# Patient Record
Sex: Female | Born: 2000 | Race: White | Hispanic: No | Marital: Single | Smoking: Never smoker
Health system: Southern US, Community
[De-identification: ages and names within clinical notes are randomized; demographics above are authoritative.]

## PROBLEM LIST (undated history)

## (undated) ENCOUNTER — Inpatient Hospital Stay (HOSPITAL_COMMUNITY): Payer: Self-pay

## (undated) DIAGNOSIS — Z1589 Genetic susceptibility to other disease: Secondary | ICD-10-CM

## (undated) DIAGNOSIS — E282 Polycystic ovarian syndrome: Secondary | ICD-10-CM

## (undated) DIAGNOSIS — E7212 Methylenetetrahydrofolate reductase deficiency: Secondary | ICD-10-CM

## (undated) HISTORY — PX: NO PAST SURGERIES: SHX2092

## (undated) HISTORY — DX: Genetic susceptibility to other disease: Z15.89

## (undated) HISTORY — DX: Methylenetetrahydrofolate reductase deficiency: E72.12

---
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
## 2018-08-20 IMAGING — MR MR HEAD WO/W CM
17 series · 48 of 48 positions shown · IV contrast (MULTIHANCE)
Comparison: None.

CLINICAL DATA: Hair loss for 2 weeks, irregular menstrual cycles.
Assess for pituitary tumor.

EXAM:
MRI HEAD WITHOUT AND WITH CONTRAST
TECHNIQUE: Multiplanar, multiecho pulse sequences of the brain and surrounding
structures were obtained without and with intravenous contrast.
CONTRAST:  9mL MULTIHANCE GADOBENATE DIMEGLUMINE 529 MG/ML IV SOLN

[Series 5: T1 · sagittal · 4.0mm · 0.72mm/px · 1 of 27 slices shown (1 of 5)]
[im 1/27]
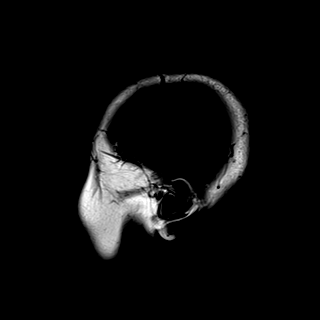

[Series 6: DWI · axial · 3.0mm · 1.44mm/px · z∈[-42,+118]mm · 8 of 84 slices shown (1 of 2)]
[im 1/84]
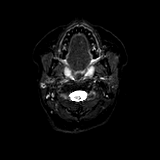
[im 12/84]
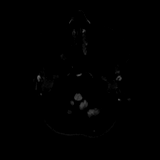
[im 24/84]
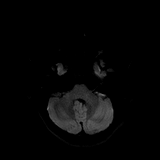
[im 36/84]
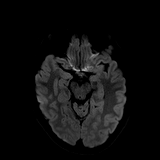
[im 48/84]
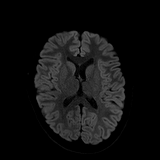
[im 60/84]
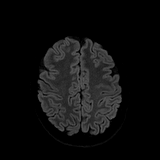
[im 72/84]
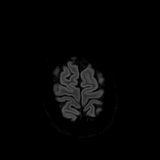
[im 84/84]
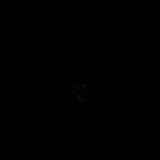

[Series 7: DWI · axial · 3.0mm · 1.44mm/px · z∈[-42,+118]mm · 4 of 42 slices shown (2 of 2)]
[im 1/42]
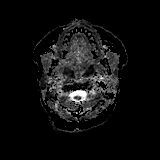
[im 14/42]
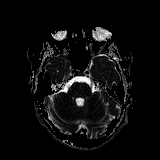
[im 28/42]
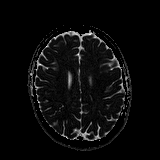
[im 42/42]
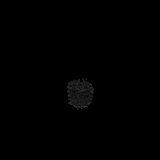

[Series 8: T2 · axial · 4.0mm · 0.36mm/px · z∈[-29,+122]mm · 3 of 30 slices shown]
[im 1/30]
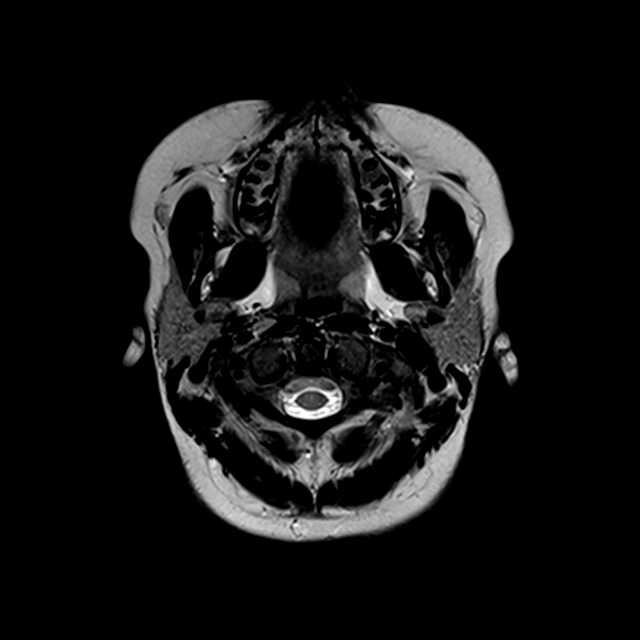
[im 15/30]
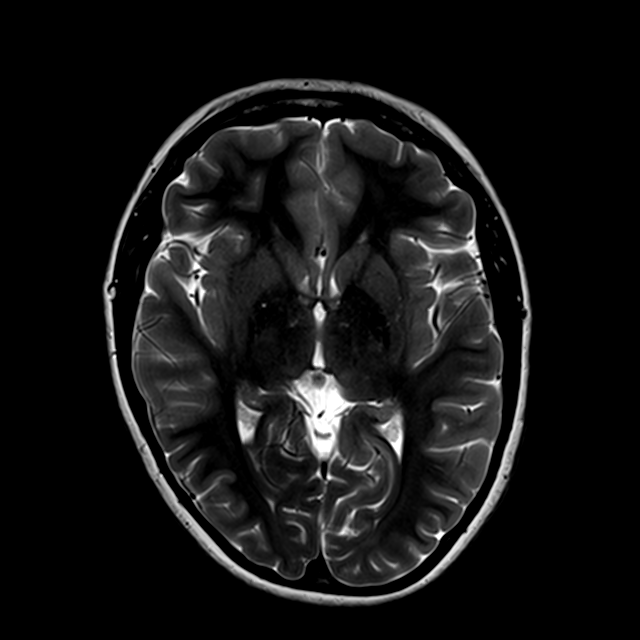
[im 30/30]
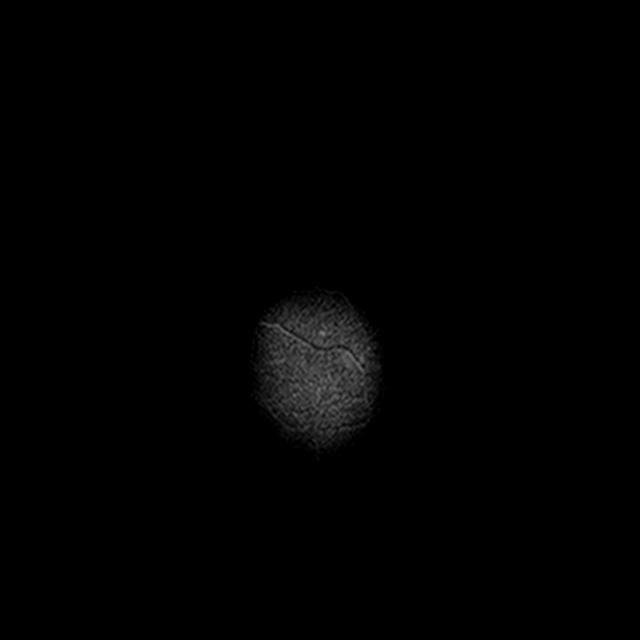

[Series 9: GRE · axial · 4.0mm · 0.69mm/px · z∈[-28,+122]mm · 3 of 30 slices shown]
[im 1/30]
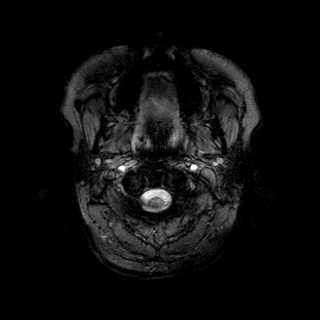
[im 15/30]
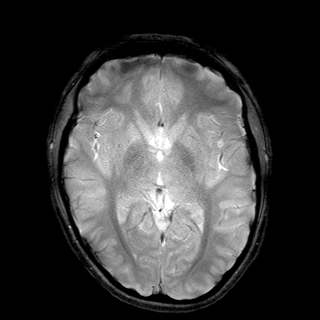
[im 30/30]
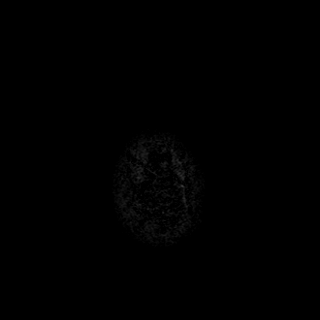

[Series 10: T1 · sagittal · 3.0mm · 0.42mm/px · 1 of 13 slices shown (2 of 5)]
[im 1/13]
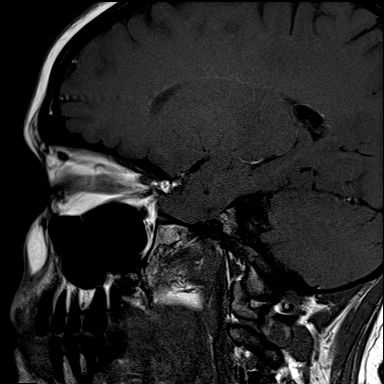

[Series 11: FLAIR · axial · 3.0mm · 0.72mm/px · z∈[-22,+116]mm · 2 of 24 slices shown]
[im 1/24]
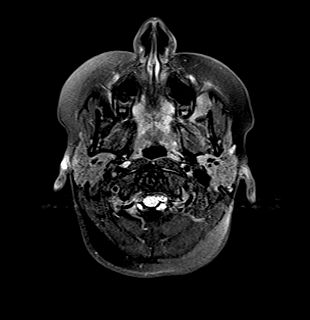
[im 24/24]
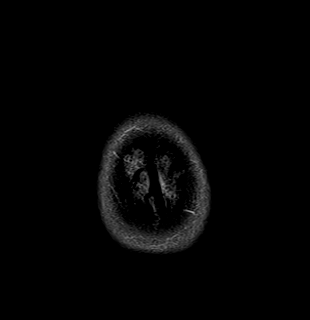

[Series 12: T1 · coronal · 3.0mm · 0.42mm/px · 1 of 10 slices shown (3 of 5)]
[im 1/10]
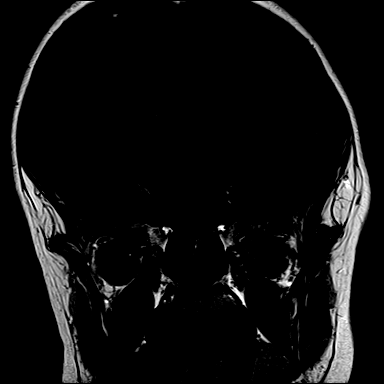

[Series 13: T1 · coronal · non-contrast · 3.0mm · 0.62mm/px · 1 of 9 slices shown (4 of 5)]
[im 1/9]
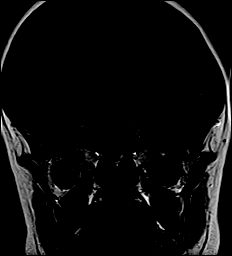

[Series 14: cor post dyn · coronal · 3.0mm · 0.62mm/px · 1 of 9 slices shown (1 of 6)]
[im 1/9]
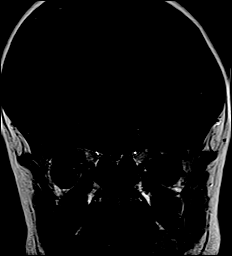

[Series 15: cor post dyn · coronal · 3.0mm · 0.62mm/px · 1 of 9 slices shown (2 of 6)]
[im 1/9]
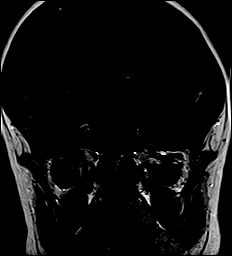

[Series 16: cor post dyn · coronal · 3.0mm · 0.62mm/px · 1 of 9 slices shown (3 of 6)]
[im 1/9]
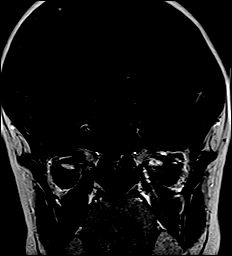

[Series 17: cor post dyn · coronal · 3.0mm · 0.62mm/px · 1 of 9 slices shown (4 of 6)]
[im 1/9]
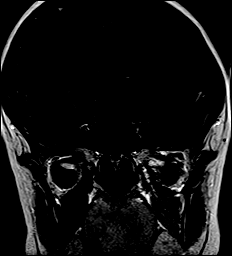

[Series 18: cor post dyn · coronal · 3.0mm · 0.62mm/px · 1 of 9 slices shown (5 of 6)]
[im 1/9]
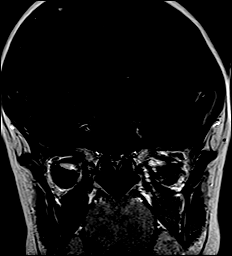

[Series 19: cor post dyn · coronal · 3.0mm · 0.62mm/px · 1 of 9 slices shown (6 of 6)]
[im 1/9]
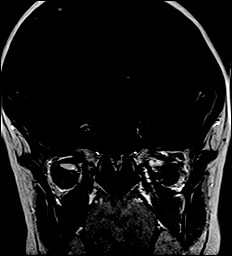

[Series 20: T1 post-contrast · sagittal · 3.0mm · 0.42mm/px · 1 of 13 slices shown (1 of 3)]
[im 1/13]
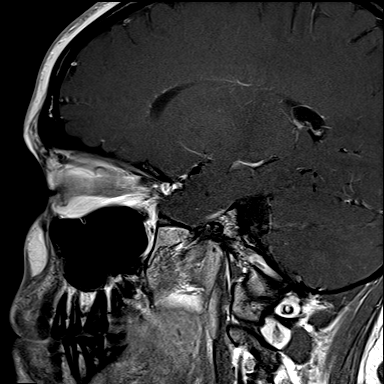

[Series 21: T1 post-contrast · coronal · 3.0mm · 0.42mm/px · 1 of 10 slices shown (2 of 3)]
[im 1/10]
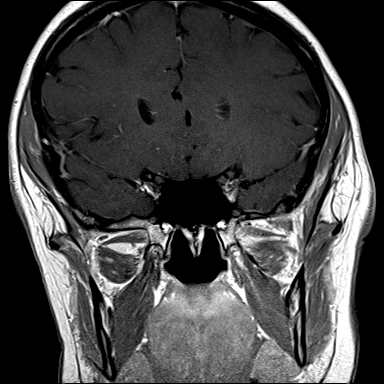

[Series 22: T1 · axial · 1.0mm · 0.86mm/px · z∈[-24,+118]mm · 13 of 144 slices shown (5 of 5)]
[im 1/144]
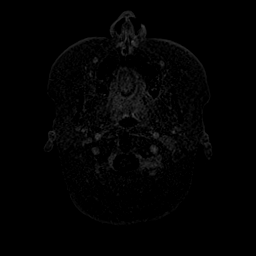
[im 12/144]
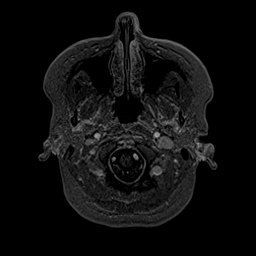
[im 24/144]
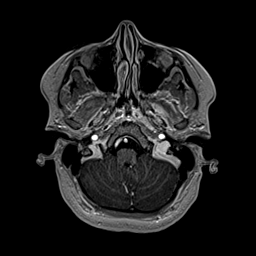
[im 36/144]
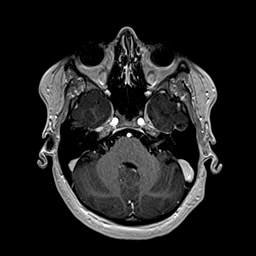
[im 48/144]
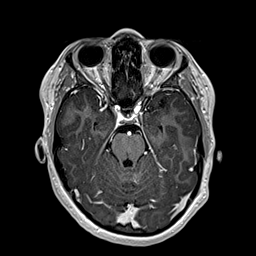
[im 60/144]
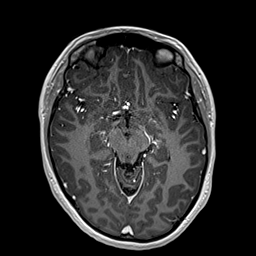
[im 72/144]
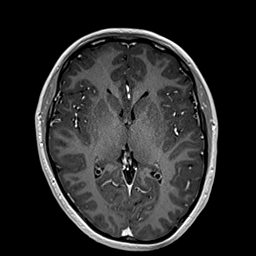
[im 84/144]
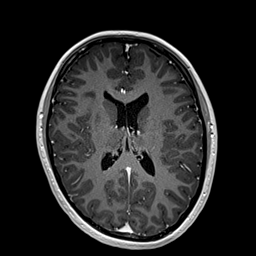
[im 96/144]
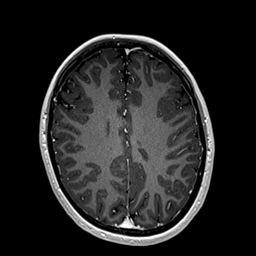
[im 108/144]
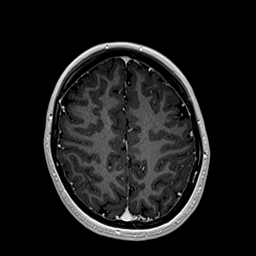
[im 120/144]
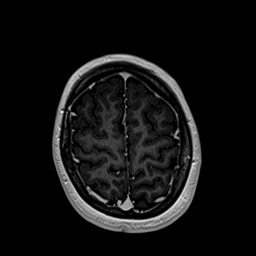
[im 132/144]
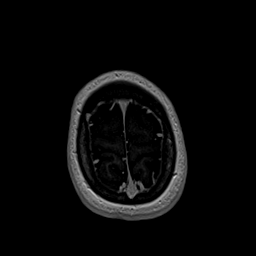
[im 144/144]
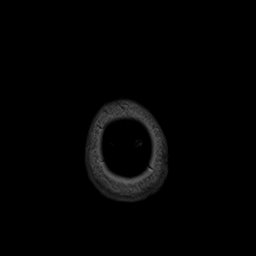

[Series 23: T1 post-contrast · coronal · 4.0mm · 0.47mm/px · 3 of 34 slices shown (3 of 3)]
[im 1/34]
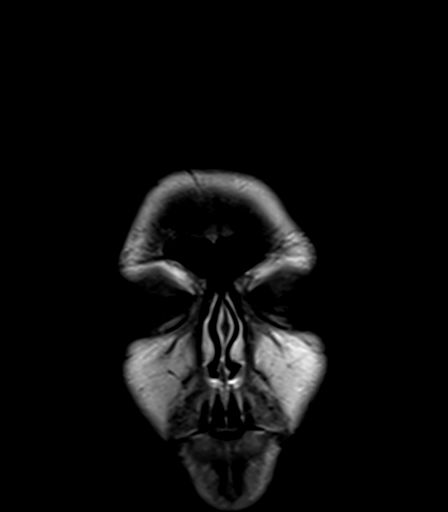
[im 17/34]
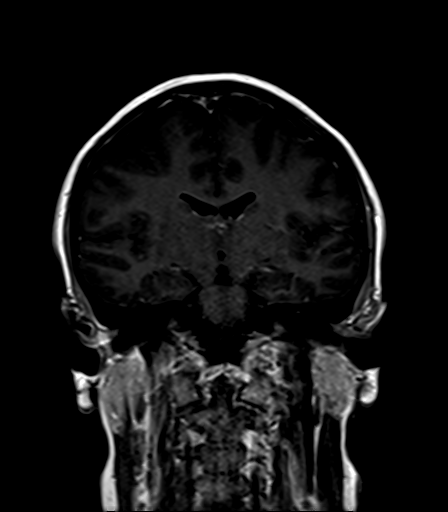
[im 34/34]
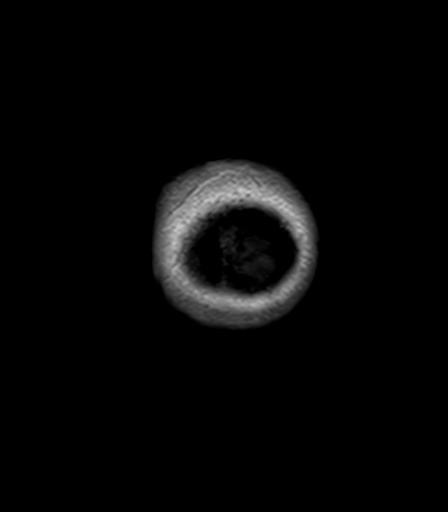

[48 of 48 positions shown; findings below may reference images not displayed]

FINDINGS: SELLA: No abnormal sellar expansion. Normal posterior pituitary
bright spot. No early foci of suspicious hypo enhancement though,
diet issues structures are mildly motion degraded. Pituitary stalk
is midline with normal enhancement. Symmetric appearance of the
cavernous sinuses and cavernous sinus flow voids. Normal appearance
of the overlying optic chiasm.

INTRACRANIAL CONTENTS: No reduced diffusion to suggest acute
ischemia or hypercellular tumor. The ventricles and sulci are normal
for patient's age. No suspicious parenchymal signal, mass lesions,
mass effect. No abnormal intraparenchymal or extra-axial
enhancement. No abnormal extra-axial fluid collections.

VASCULAR: Normal major intracranial vascular flow voids present at
skull base.

ORBITS: The ocular globes and orbital contents are non-suspicious.

SINUSES: LEFT maxillary mucosal retention cysts without paranasal
sinus air-fluid levels. Mastoid air cells are well aerated.

SKULL/SOFT TISSUES: No suspicious calvarial bone marrow signal.
Craniocervical junction maintained.
IMPRESSION: 1. Normal MRI of the brain and sella with and without contrast.

## 2018-09-14 NOTE — Progress Notes (Addendum)
17 y.o. G0P0 Single White or Caucasian Not Hispanic or Latino female here for annual exam.   Period Cycle (Days): 30 Period Duration (Days): 3-6 Period Pattern: Regular Menstrual Flow: Moderate Menstrual Control: Maxi pad, Tampon Menstrual Control Change Freq (Hours): changes pad/tampon 4 times a day Dysmenorrhea: (!) Severe Dysmenorrhea Symptoms: Cramping, Nausea, Other (Comment)(back pain )  The cramps are bad for 1.5 weeks, helped with ibuprofen and tumeric.  Endocrinologist diagnosed her with PCOS, at that time she was having irregular cycles. She was on metformin short term, didn't help she went off of it. She lost weight on her own.   She has multiple GI sensitivities, has some baseline constipation. TSH was okay. She has cut out gluten and dairy.  Has a BM 2-3 x a day, firm.  Not sexually active  Patient's last menstrual period was 09/08/2018.          Sexually active: No.  The current method of family planning is none.    Exercising: Yes.    cardio/weights Smoker:  no  Health Maintenance: Pap:  N/A History of abnormal Pap:  no TDaP:  2011 Gardasil: no    reports that she has never smoked. She has never used smokeless tobacco. She reports that she does not drink alcohol or use drugs. She is a sophomore in nursing school.   History reviewed. No pertinent past medical history.  Past Surgical History:  Procedure Laterality Date  . NO PAST SURGERIES      No current outpatient medications on file.   No current facility-administered medications for this visit.     Family History  Problem Relation Age of Onset  . Thyroid disease Mother   . Endometriosis Mother   . Uterine cancer Maternal Grandmother   . Heart disease Maternal Grandfather   . Hypertension Maternal Grandfather   . Hypertension Paternal Grandmother   . Thyroid disease Paternal Grandmother   . ALS Paternal Grandmother   . Leukemia Paternal Grandfather     Review of Systems  Constitutional: Negative.    HENT: Negative.   Eyes: Negative.   Respiratory: Negative.   Cardiovascular: Positive for palpitations.  Gastrointestinal: Positive for abdominal distention, abdominal pain and nausea.  Endocrine: Positive for cold intolerance.  Genitourinary: Positive for menstrual problem.       Breast pain  Loss of urine with sneeze or cough  Vulvar/vaginal itching   Musculoskeletal: Negative.   Skin: Negative.   Allergic/Immunologic: Negative.   Neurological: Negative.   Hematological: Bruises/bleeds easily.  Psychiatric/Behavioral: Negative.     Exam:   BP 98/64 (BP Location: Right Arm, Patient Position: Sitting, Cuff Size: Normal)   Pulse 74   Resp 14   Ht 5' 3.5" (1.613 m)   Wt 179 lb (81.2 kg)   LMP 09/08/2018   BMI 31.21 kg/m   Weight change: @WEIGHTCHANGE @ Height:   Height: 5' 3.5" (161.3 cm)  Ht Readings from Last 3 Encounters:  09/18/18 5' 3.5" (1.613 m) (38 %, Z= -0.31)*  12/26/14 5' 8.5" (1.74 m) (96 %, Z= 1.79)*  09/02/13 5\' 4"  (1.626 m) (60 %, Z= 0.24)*   * Growth percentiles are based on CDC (Girls, 2-20 Years) data.    General appearance: alert, cooperative and appears stated age Head: Normocephalic, without obvious abnormality, atraumatic Neck: no adenopathy, supple, symmetrical, trachea midline and thyroid normal to inspection and palpation Lungs: clear to auscultation bilaterally Cardiovascular: regular rate and rhythm Breasts: normal appearance, no masses or tenderness Abdomen: soft, non-tender; non distended,  no  masses,  no organomegaly Extremities: extremities normal, atraumatic, no cyanosis or edema Skin: Skin color, texture, turgor normal. No rashes or lesions Lymph nodes: Cervical, supraclavicular, and axillary nodes normal. No abnormal inguinal nodes palpated Neurologic: Grossly normal   Pelvic: External genitalia:  no lesions              Urethra:  normal appearing urethra with no masses, tenderness or lesions              Bartholins and Skenes:  normal                 Cervix: no cervical motion tenderness               Bimanual Exam:  Uterus:  anteverted, mobile, +/- tender, not appreciably enlarged              Adnexa: no mass, fullness, tenderness               Pelvic floor: not tender   Bladder: mildly tender   Chaperone was present for exam.  A:  Well Woman with normal exam  Mom with MTHFR gene, patient may have been checked, will get results  Severe dysmenorrhea  GI sensitivities, has abdominal pain and constipation from eating cafeteria food. Requests form be filled out so she can cook her own food  ?elevated prolactin  Was diagnosed with PCOS, no hirsutism or acne, normal monthly cycles with 20 lb weight loss    P:   No pap, no std testing  Get a copy of her labs from Endocrine and Primary  If her MTHFR gene was negative will start OCP's, risks reviewed and information was given  If MTHFR was +, consider mirena IUD, information given  Continue ibuprofen as needed  Discussed breast self awareness   Awaiting last prolactin level, suspect she needs recheck  Records from Dr Sharl MaKerr reviewed: She had a mildly elevated prolactin of 13.91 (2.64-13.13) in 2018 She had a normal brain MRI in 10/18 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  If homocysteine level is normal, will call in OCP's

## 2018-09-18 ENCOUNTER — Other Ambulatory Visit: Payer: Self-pay

## 2018-09-18 ENCOUNTER — Encounter: Payer: Self-pay | Admitting: Obstetrics and Gynecology

## 2018-09-18 ENCOUNTER — Ambulatory Visit: Payer: BLUE CROSS/BLUE SHIELD | Admitting: Obstetrics and Gynecology

## 2018-09-18 VITALS — BP 98/64 | HR 74 | Resp 14 | Ht 63.5 in | Wt 179.0 lb

## 2018-09-18 DIAGNOSIS — Z01419 Encounter for gynecological examination (general) (routine) without abnormal findings: Secondary | ICD-10-CM

## 2018-09-18 DIAGNOSIS — N946 Dysmenorrhea, unspecified: Secondary | ICD-10-CM

## 2018-09-18 NOTE — Patient Instructions (Addendum)
Oral Contraception Information Oral contraceptive pills (OCPs) are medicines taken to prevent pregnancy. OCPs work by preventing the ovaries from releasing eggs. The hormones in OCPs also cause the cervical mucus to thicken, preventing the sperm from entering the uterus. The hormones also cause the uterine lining to become thin, not allowing a fertilized egg to attach to the inside of the uterus. OCPs are highly effective when taken exactly as prescribed. However, OCPs do not prevent sexually transmitted diseases (STDs). Safe sex practices, such as using condoms along with the pill, can help prevent STDs. Before taking the pill, you may have a physical exam and Pap test. Your health care provider may order blood tests. The health care provider will make sure you are a good candidate for oral contraception. Discuss with your health care provider the possible side effects of the OCP you may be prescribed. When starting an OCP, it can take 2 to 3 months for the body to adjust to the changes in hormone levels in your body. Types of oral contraception  The combination pill-This pill contains estrogen and progestin (synthetic progesterone) hormones. The combination pill comes in 21-day, 28-day, or 91-day packs. Some types of combination pills are meant to be taken continuously (365-day pills). With 21-day packs, you do not take pills for 7 days after the last pill. With 28-day packs, the pill is taken every day. The last 7 pills are without hormones. Certain types of pills have more than 21 hormone-containing pills. With 91-day packs, the first 84 pills contain both hormones, and the last 7 pills contain no hormones or contain estrogen only.  The minipill-This pill contains the progesterone hormone only. The pill is taken every day continuously. It is very important to take the pill at the same time each day. The minipill comes in packs of 28 pills. All 28 pills contain the hormone. Advantages of oral  contraceptive pills  Decreases premenstrual symptoms.  Treats menstrual period cramps.  Regulates the menstrual cycle.  Decreases a heavy menstrual flow.  May treatacne, depending on the type of pill.  Treats abnormal uterine bleeding.  Treats polycystic ovarian syndrome.  Treats endometriosis.  Can be used as emergency contraception. Things that can make oral contraceptive pills less effective OCPs can be less effective if:  You forget to take the pill at the same time every day.  You have a stomach or intestinal disease that lessens the absorption of the pill.  You take OCPs with other medicines that make OCPs less effective, such as antibiotics, certain HIV medicines, and some seizure medicines.  You take expired OCPs.  You forget to restart the pill on day 7, when using the packs of 21 pills.  Risks associated with oral contraceptive pills Oral contraceptive pills can sometimes cause side effects, such as:  Headache.  Nausea.  Breast tenderness.  Irregular bleeding or spotting.  Combination pills are also associated with a small increased risk of:  Blood clots.  Heart attack.  Stroke.  This information is not intended to replace advice given to you by your health care provider. Make sure you discuss any questions you have with your health care provider. Document Released: 01/01/2003 Document Revised: 03/18/2016 Document Reviewed: 04/01/2013 Elsevier Interactive Patient Education  2018 [employer]   EXERCISE AND DIET:  We recommended that you start or continue a regular exercise program for good health. Regular exercise means any activity that makes your heart beat faster and makes you sweat.  We recommend exercising at least 30   minutes per day at least 3 days a week, preferably 4 or 5.  We also recommend a diet low in fat and sugar.  Inactivity, poor dietary choices and obesity can cause diabetes, heart attack, stroke, and kidney damage, among others.      ALCOHOL AND SMOKING:  Women should limit their alcohol intake to no more than 7 drinks/beers/glasses of wine (combined, not each!) per week. Moderation of alcohol intake to this level decreases your risk of breast cancer and liver damage. And of course, no recreational drugs are part of a healthy lifestyle.  And absolutely no smoking or even second hand smoke. Most people know smoking can cause heart and lung diseases, but did you know it also contributes to weakening of your bones? Aging of your skin?  Yellowing of your teeth and nails?  CALCIUM AND VITAMIN D:  Adequate intake of calcium and Vitamin D are recommended.  The recommendations for exact amounts of these supplements seem to change often, but generally speaking 600 mg of calcium (either carbonate or citrate) and 800 units of Vitamin D per day seems prudent. Certain women may benefit from higher intake of Vitamin D.  If you are among these women, your doctor will have told you during your visit.    PAP SMEARS:  Pap smears, to check for cervical cancer or precancers,  have traditionally been done yearly, although recent scientific advances have shown that most women can have pap smears less often.  However, every woman still should have a physical exam from her gynecologist every year. It will include a breast check, inspection of the vulva and vagina to check for abnormal growths or skin changes, a visual exam of the cervix, and then an exam to evaluate the size and shape of the uterus and ovaries.  And after 19 years of age, a rectal exam is indicated to check for rectal cancers. We will also provide age appropriate advice regarding health maintenance, like when you should have certain vaccines, screening for sexually transmitted diseases, bone density testing, colonoscopy, mammograms, etc.   MAMMOGRAMS:  All women over 40 years old should have a yearly mammogram. Many facilities now offer a "3D" mammogram, which may cost around $50 extra out of  pocket. If possible,  we recommend you accept the option to have the 3D mammogram performed.  It both reduces the number of women who will be called back for extra views which then turn out to be normal, and it is better than the routine mammogram at detecting truly abnormal areas.    COLONOSCOPY:  Colonoscopy to screen for colon cancer is recommended for all women at age 50.  We know, you hate the idea of the prep.  We agree, BUT, having colon cancer and not knowing it is worse!!  Colon cancer so often starts as a polyp that can be seen and removed at colonscopy, which can quite literally save your life!  And if your first colonoscopy is normal and you have no family history of colon cancer, most women don't have to have it again for 10 years.  Once every ten years, you can do something that may end up saving your life, right?  We will be happy to help you get it scheduled when you are ready.  Be sure to check your insurance coverage so you understand how much it will cost.  It may be covered as a preventative service at no cost, but you should check your particular policy.        Breast Self-Awareness Breast self-awareness means being familiar with how your breasts look and feel. It involves checking your breasts regularly and reporting any changes to your health care provider. Practicing breast self-awareness is important. A change in your breasts can be a sign of a serious medical problem. Being familiar with how your breasts look and feel allows you to find any problems early, when treatment is more likely to be successful. All women should practice breast self-awareness, including women who have had breast implants. How to do a breast self-exam One way to learn what is normal for your breasts and whether your breasts are changing is to do a breast self-exam. To do a breast self-exam: Look for Changes  Remove all the clothing above your waist. Stand in front of a mirror in a room with good  lighting. Put your hands on your hips. Push your hands firmly downward. Compare your breasts in the mirror. Look for differences between them (asymmetry), such as: Differences in shape. Differences in size. Puckers, dips, and bumps in one breast and not the other. Look at each breast for changes in your skin, such as: Redness. Scaly areas. Look for changes in your nipples, such as: Discharge. Bleeding. Dimpling. Redness. A change in position. Feel for Changes  Carefully feel your breasts for lumps and changes. It is best to do this while lying on your back on the floor and again while sitting or standing in the shower or tub with soapy water on your skin. Feel each breast in the following way: Place the arm on the side of the breast you are examining above your head. Feel your breast with the other hand. Start in the nipple area and make  inch (2 cm) overlapping circles to feel your breast. Use the pads of your three middle fingers to do this. Apply light pressure, then medium pressure, then firm pressure. The light pressure will allow you to feel the tissue closest to the skin. The medium pressure will allow you to feel the tissue that is a little deeper. The firm pressure will allow you to feel the tissue close to the ribs. Continue the overlapping circles, moving downward over the breast until you feel your ribs below your breast. Move one finger-width toward the center of the body. Continue to use the  inch (2 cm) overlapping circles to feel your breast as you move slowly up toward your collarbone. Continue the up and down exam using all three pressures until you reach your armpit.  Write Down What You Find  Write down what is normal for each breast and any changes that you find. Keep a written record with breast changes or normal findings for each breast. By writing this information down, you do not need to depend only on memory for size, tenderness, or location. Write down where  you are in your menstrual cycle, if you are still menstruating. If you are having trouble noticing differences in your breasts, do not get discouraged. With time you will become more familiar with the variations in your breasts and more comfortable with the exam. How often should I examine my breasts? Examine your breasts every month. If you are breastfeeding, the best time to examine your breasts is after a feeding or after using a breast pump. If you menstruate, the best time to examine your breasts is 5-7 days after your period is over. During your period, your breasts are lumpier, and it may be more difficult to notice changes. When should I   see my health care provider? See your health care provider if you notice: A change in shape or size of your breasts or nipples. A change in the skin of your breast or nipples, such as a reddened or scaly area. Unusual discharge from your nipples. A lump or thick area that was not there before. Pain in your breasts. Anything that concerns you.  This information is not intended to replace advice given to you by your health care provider. Make sure you discuss any questions you have with your health care provider. Document Released: 10/11/2005 Document Revised: 03/18/2016 Document Reviewed: 08/31/2015 Elsevier Interactive Patient Education  2018 [employer] Inc[employer]  

## 2018-09-29 ENCOUNTER — Encounter: Payer: Self-pay | Admitting: Obstetrics and Gynecology

## 2018-09-29 ENCOUNTER — Telehealth: Payer: Self-pay | Admitting: Obstetrics and Gynecology

## 2018-09-29 DIAGNOSIS — Z1589 Genetic susceptibility to other disease: Secondary | ICD-10-CM | POA: Insufficient documentation

## 2018-09-29 DIAGNOSIS — Z01419 Encounter for gynecological examination (general) (routine) without abnormal findings: Secondary | ICD-10-CM

## 2018-09-29 DIAGNOSIS — Z0000 Encounter for general adult medical examination without abnormal findings: Secondary | ICD-10-CM

## 2018-09-29 DIAGNOSIS — E7212 Methylenetetrahydrofolate reductase deficiency: Secondary | ICD-10-CM

## 2018-09-29 NOTE — Telephone Encounter (Signed)
Please let the patient know that I got her records from Dr Sharl MaKerr and she needs another prolactin level. She should also have a fasting lipid panel and HgbA1C secondary to her diagnosis of PCOS.  I also have the records from her primary. As her mom said she is heterozygous for the MTHFR gene. This in itself is not likely to increase her risk of clotting on OCP's. To be very careful, please also order a fasting homocysteine level on her, if that's normal she can start OCP's. So please have her come in for: Prolactin, fasting lipid panel, HgbA1C, fasting homocysteine level.

## 2018-10-02 NOTE — Telephone Encounter (Signed)
Left message to call Tamara Sutton at [phone]. 

## 2018-10-10 NOTE — Telephone Encounter (Signed)
Patient returned call and message from Dr. Oscar LaJertson given.  Patient is scheduled for fasting lab work tomorrow.  She will call her insurance to discuss cost, she may need to reschedule. Advised call back if she needs to reschedule.  Encounter closed.

## 2018-10-11 ENCOUNTER — Other Ambulatory Visit: Payer: Self-pay

## 2018-10-11 ENCOUNTER — Telehealth: Payer: Self-pay | Admitting: Obstetrics and Gynecology

## 2018-10-11 NOTE — Telephone Encounter (Signed)
Call to patient regarding missed lab appointment. Her mom is checking with insurance to see if labs are covered before coming in.

## 2019-01-22 ENCOUNTER — Encounter: Payer: Self-pay | Admitting: Obstetrics and Gynecology

## 2019-01-22 ENCOUNTER — Other Ambulatory Visit: Payer: Self-pay

## 2019-01-22 ENCOUNTER — Telehealth: Payer: Self-pay | Admitting: Obstetrics and Gynecology

## 2019-01-22 ENCOUNTER — Ambulatory Visit: Payer: BLUE CROSS/BLUE SHIELD | Admitting: Obstetrics and Gynecology

## 2019-01-22 VITALS — BP 104/58 | HR 64 | Temp 98.0°F | Resp 12 | Ht 63.5 in | Wt 178.0 lb

## 2019-01-22 DIAGNOSIS — Z113 Encounter for screening for infections with a predominantly sexual mode of transmission: Secondary | ICD-10-CM | POA: Diagnosis not present

## 2019-01-22 DIAGNOSIS — N898 Other specified noninflammatory disorders of vagina: Secondary | ICD-10-CM | POA: Diagnosis not present

## 2019-01-22 DIAGNOSIS — N760 Acute vaginitis: Secondary | ICD-10-CM | POA: Diagnosis not present

## 2019-01-22 MED ORDER — NYSTATIN-TRIAMCINOLONE 100000-0.1 UNIT/GM-% EX CREA: 1.0000 "application " | TOPICAL_CREAM | Freq: Two times a day (BID) | CUTANEOUS | 0 refills | Status: DC

## 2019-01-22 NOTE — Telephone Encounter (Signed)
Patient has a discharge with odor and anus is painful and itchy.

## 2019-01-22 NOTE — Patient Instructions (Signed)
Vaginitis  Vaginitis is a condition in which the vaginal tissue swells and becomes red (inflamed). This condition is most often caused by a change in the normal balance of bacteria and yeast that live in the vagina. This change causes an overgrowth of certain bacteria or yeast, which causes the inflammation. There are different types of vaginitis, but the most common types are:   Bacterial vaginosis.   Yeast infection (candidiasis).   Trichomoniasis vaginitis. This is a sexually transmitted disease (STD).   Viral vaginitis.   Atrophic vaginitis.   Allergic vaginitis.  What are the causes?  The cause of this condition depends on the type of vaginitis. It can be caused by:   Bacteria (bacterial vaginosis).   Yeast, which is a fungus (yeast infection).   A parasite (trichomoniasis vaginitis).   A virus (viral vaginitis).   Low hormone levels (atrophic vaginitis). Low hormone levels can occur during pregnancy, breastfeeding, or after menopause.   Irritants, such as bubble baths, scented tampons, and feminine sprays (allergic vaginitis).  Other factors can change the normal balance of the yeast and bacteria that live in the vagina. These include:   Antibiotic medicines.   Poor hygiene.   Diaphragms, vaginal sponges, spermicides, birth control pills, and intrauterine devices (IUD).   Sex.   Infection.   Uncontrolled diabetes.   A weakened defense (immune) system.  What increases the risk?  This condition is more likely to develop in women who:   Smoke.   Use vaginal douches, scented tampons, or scented sanitary pads.   Wear tight-fitting pants.   Wear thong underwear.   Use oral birth control pills or an IUD.   Have sex without a condom.   Have multiple sex partners.   Have an STD.   Frequently use the spermicide nonoxynol-9.   Eat lots of foods high in sugar.   Have uncontrolled diabetes.   Have low estrogen levels.   Have a weakened immune system from an immune disorder or medical  treatment.   Are pregnant or breastfeeding.  What are the signs or symptoms?  Symptoms vary depending on the cause of the vaginitis. Common symptoms include:   Abnormal vaginal discharge.  ? The discharge is white, gray, or yellow with bacterial vaginosis.  ? The discharge is thick, white, and cheesy with a yeast infection.  ? The discharge is frothy and yellow or greenish with trichomoniasis.   A bad vaginal smell. The smell is fishy with bacterial vaginosis.   Vaginal itching, pain, or swelling.   Sex that is painful.   Pain or burning when urinating.  Sometimes there are no symptoms.  How is this diagnosed?  This condition is diagnosed based on your symptoms and medical history. A physical exam, including a pelvic exam, will also be done. You may also have other tests, including:   Tests to determine the pH level (acidity or alkalinity) of your vagina.   A whiff test, to assess the odor that results when a sample of your vaginal discharge is mixed with a potassium hydroxide solution.   Tests of vaginal fluid. A sample will be examined under a microscope.  How is this treated?  Treatment varies depending on the type of vaginitis you have. Your treatment may include:   Antibiotic creams or pills to treat bacterial vaginosis and trichomoniasis.   Antifungal medicines, such as vaginal creams or suppositories, to treat a yeast infection.   Medicine to ease discomfort if you have viral vaginitis. Your sexual partner   should also be treated.   Estrogen delivered in a cream, pill, suppository, or vaginal ring to treat atrophic vaginitis. If vaginal dryness occurs, lubricants and moisturizing creams may help. You may need to avoid scented soaps, sprays, or douches.   Stopping use of a product that is causing allergic vaginitis. Then using a vaginal cream to treat the symptoms.  Follow these instructions at home:  Lifestyle   Keep your genital area clean and dry. Avoid soap, and only rinse the area with  water.   Do not douche or use tampons until your health care provider says it is okay to do so. Use sanitary pads, if needed.   Do not have sex until your health care provider approves. When you can return to sex, practice safe sex and use condoms.   Wipe from front to back. This avoids the spread of bacteria from the rectum to the vagina.  General instructions   Take over-the-counter and prescription medicines only as told by your health care provider.   If you were prescribed an antibiotic medicine, take or use it as told by your health care provider. Do not stop taking or using the antibiotic even if you start to feel better.   Keep all follow-up visits as told by your health care provider. This is important.  How is this prevented?   Use mild, non-scented products. Do not use things that can irritate the vagina, such as fabric softeners. Avoid the following products if they are scented:  ? Feminine sprays.  ? Detergents.  ? Tampons.  ? Feminine hygiene products.  ? Soaps or bubble baths.   Let air reach your genital area.  ? Wear cotton underwear to reduce moisture buildup.  ? Avoid wearing underwear while you sleep.  ? Avoid wearing tight pants and underwear or nylons without a cotton panel.  ? Avoid wearing thong underwear.   Take off any wet clothing, such as bathing suits, as soon as possible.   Practice safe sex and use condoms.  Contact a health care provider if:   You have abdominal pain.   You have a fever.   You have symptoms that last for more than 2-3 days.  Get help right away if:   You have a fever and your symptoms suddenly get worse.  Summary   Vaginitis is a condition in which the vaginal tissue becomes inflamed.This condition is most often caused by a change in the normal balance of bacteria and yeast that live in the vagina.   Treatment varies depending on the type of vaginitis you have.   Do not douche, use tampons , or have sex until your health care provider approves. When  you can return to sex, practice safe sex and use condoms.  This information is not intended to replace advice given to you by your health care provider. Make sure you discuss any questions you have with your health care provider.  Document Released: 08/08/2007 Document Revised: 11/16/2016 Document Reviewed: 11/16/2016  Elsevier Interactive Patient Education  2019 [employer] Inc[employer]

## 2019-01-22 NOTE — Progress Notes (Signed)
GYNECOLOGY  VISIT   HPI: 18 y.o.   Single  Caucasian  female   G0P0 with Patient's last menstrual period was 01/10/2019.   here for vaginal odor, discharge, and itching in vagina and rectum x 3 weeks   Discharge is white and clear.  Feeling vaginal burning.   Notes anal itching.  Difficulty sleeping due to itching.  Bothersome during the day as well.   No change in exposures.  Sweating more and asking if this is the cause of her symptoms.   Last sexual activity was December.  No protection used.   Denies pelvic pain or abnormal uterine bleeding.   GYNECOLOGIC HISTORY: Patient's last menstrual period was 01/10/2019. Contraception:  None -- per patient, not SA since December 2019 Menopausal hormone therapy:  none Last mammogram:  n/a Last pap smear:   n/a        OB History    Gravida  0   Para      Term      Preterm      AB      Living        SAB      TAB      Ectopic      Multiple      Live Births                 Patient Active Problem List   Diagnosis Date Noted  . Heterozygous for MTHFR gene mutation [facility])     Past Medical History:  Diagnosis Date  . Heterozygous for MTHFR gene mutation [facility])     Past Surgical History:  Procedure Laterality Date  . NO PAST SURGERIES      No current outpatient medications on file.   No current facility-administered medications for this visit.      ALLERGIES: Wasp venom  Family History  Problem Relation Age of Onset  . Thyroid disease Mother   . Endometriosis Mother   . Uterine cancer Maternal Grandmother   . Heart disease Maternal Grandfather   . Hypertension Maternal Grandfather   . Hypertension Paternal Grandmother   . Thyroid disease Paternal Grandmother   . ALS Paternal Grandmother   . Leukemia Paternal Grandfather     Social History   Socioeconomic History  . Marital status: Single    Spouse name: Not on file  . Number of children: 0  . Years of education: Not on file  . Highest  education level: Not on file  Occupational History  . Not on file  Social Needs  . Financial resource strain: Not on file  . Food insecurity:    Worry: Not on file    Inability: Not on file  . Transportation needs:    Medical: Not on file    Non-medical: Not on file  Tobacco Use  . Smoking status: Never Smoker  . Smokeless tobacco: Never Used  Substance and Sexual Activity  . Alcohol use: No    Alcohol/week: 0.0 standard drinks  . Drug use: No  . Sexual activity: Never    Partners: Male    Comment: 2016, never sexually active  Lifestyle  . Physical activity:    Days per week: Not on file    Minutes per session: Not on file  . Stress: Not on file  Relationships  . Social connections:    Talks on phone: Not on file    Gets together: Not on file    Attends religious service: Not on file  Active member of club or organization: Not on file    Attends meetings of clubs or organizations: Not on file    Relationship status: Not on file  . Intimate partner violence:    Fear of current or ex partner: Not on file    Emotionally abused: Not on file    Physically abused: Not on file    Forced sexual activity: Not on file  Other Topics Concern  . Not on file  Social History Narrative  . Not on file    Review of Systems  Constitutional: Negative.   HENT: Negative.   Eyes: Negative.   Respiratory: Negative.   Cardiovascular: Negative.   Gastrointestinal: Negative.   Endocrine: Negative.   Genitourinary: Positive for vaginal discharge.       Vaginal itching Vaginal odor  Musculoskeletal: Negative.   Skin: Negative.   Allergic/Immunologic: Negative.   Neurological: Negative.   Hematological: Negative.   Psychiatric/Behavioral: Negative.     PHYSICAL EXAMINATION:    BP (!) 104/58 (BP Location: Left Arm, Patient Position: Sitting, Cuff Size: Large)   Pulse 64   Temp 98 F (36.7 C) (Oral)   Resp 12   Ht 5' 3.5" (1.613 m)   Wt 178 lb (80.7 kg)   LMP 01/10/2019    BMI 31.04 kg/m     General appearance: alert, cooperative and appears stated age  Pelvic: External genitalia:   Labia with generalized erythema.               Urethra:  normal appearing urethra with no masses, tenderness or lesions              Bartholins and Skenes: normal                 Vagina: normal appearing vagina with normal color and discharge, no lesions              Cervix: no lesions                Bimanual Exam:  Uterus:  normal size, contour, position, consistency, mobility, non-tender              Adnexa: no mass, fullness, tenderness                          Anus:  No perianal lesions noted.   Chaperone was present for exam.  ASSESSMENT  Vaginal discharge.  Vulvovaginitis.  STD screening.   PLAN  We discussed vaginitis - types and risk factors.  Nuswab sent to include yeast, BV, trichomonas, GC/CT testing.  She declines serum STD screening.  She will consider this for her routine visit this summer. We reviewed condom use.  Rx for Mycolog II.   Additional tx to follow.    An After Visit Summary was printed and given to the patient.  __15____ minutes face to face time of which over 50% was spent in counseling.

## 2019-01-22 NOTE — Telephone Encounter (Signed)
Spoke with patients mom, Marcelino Duster, ok per dpr. Requesting OV for anal itching and burning, vaginal d/c and odor. Symptoms started 2-3 wks ago when patient was in [place]. Has tried applying Desitin to anus, no relief. LMP approximately 3 wks ago. Denies pelvic pain, fever/chills, cough, SOB. Mom declined OV today with covering provider, OV scheduled for 3/31 at 11am with Dr. Oscar La.   Routing to provider for final review. Patient is agreeable to disposition. Will close encounter.

## 2019-01-22 NOTE — Telephone Encounter (Signed)
Left message to call Virginia Francisco, RN at [facility] [phone].   

## 2019-01-23 ENCOUNTER — Ambulatory Visit: Payer: Self-pay | Admitting: Obstetrics and Gynecology

## 2019-01-30 LAB — NUSWAB VAGINITIS PLUS (VG+)
CANDIDA ALBICANS, NAA: NEGATIVE
CHLAMYDIA TRACHOMATIS, NAA: NEGATIVE
Candida glabrata, NAA: NEGATIVE
NEISSERIA GONORRHOEAE, NAA: NEGATIVE
Trich vag by NAA: NEGATIVE

## 2020-03-27 DIAGNOSIS — H6123 Impacted cerumen, bilateral: Secondary | ICD-10-CM | POA: Diagnosis not present

## 2020-03-31 ENCOUNTER — Telehealth: Payer: Self-pay

## 2020-03-31 NOTE — Telephone Encounter (Signed)
Last AEX 08/2018 Next AEX 09/17/20 with JJ Last OV 12/2018 with neg vaginitis test  Spoke with pt. Pt c/o having mild menstrual abd cramps and a longer cycle this month. Pt states having regular cycles in the past 4 months, but states has noticed getting longer days from 3-4 to now 6-7 days long. Pt states this current cycle started on 03/16/20 and had regular 7 day cycle with regular flow changing diva cup every 2-3 times a day, then started spotting on 5/30 and wore only panty liner changing as needed, then started having increased spotting/bleeding this am and is using diva cup again and has changed it only once. Pt states not SA and is not on contraceptive.  Denies heavy bleeding, clots, feeling dizzy, weak or lightheaded. Pt states has hx of PCOS and FH of endometriosis.   Advised pt to have OV for further evaluation. Pt agreeable. Pt scheduled with Dr Oscar La on 04/02/20 at 1100 am. Pt verbalized understanding. CPS Neg.   Routing to Dr Oscar La for review.  Encounter closed.

## 2020-03-31 NOTE — Telephone Encounter (Signed)
Patient is calling in regards to lower abdomen pain and patient stated she has had a menstrual cycle for about 17 days. Patient states she has PCOS and a family hx of endometriosis.

## 2020-04-02 ENCOUNTER — Telehealth: Payer: Self-pay | Admitting: Obstetrics and Gynecology

## 2020-04-02 ENCOUNTER — Ambulatory Visit: Payer: Self-pay | Admitting: Obstetrics and Gynecology

## 2020-04-02 NOTE — Telephone Encounter (Signed)
Patient's appointment today for irregular cycles and cramping was canceled today due to conflict with md schedule. She is going out of town tomorrow and is only available next Tuesday through Friday. No available appointments, to triage to assess  and reschedule.

## 2020-04-02 NOTE — Telephone Encounter (Signed)
Message left to return call to Triage Nurse at [phone].   Per Yvonna Alanis, Nursing Supervisor, offer patient OV appointment on 04-08-20 at 1445 for appointment reschedule.

## 2020-04-07 NOTE — Telephone Encounter (Signed)
Spoke with patient.  OV rescheduled to 04/08/20 at 2:45pm. Covid 19 prescreen negative, precautions reviewed.   Encounter closed.

## 2020-04-07 NOTE — Telephone Encounter (Signed)
Patient returned call to schedule cancelled appointment.

## 2020-04-08 ENCOUNTER — Ambulatory Visit: Payer: BLUE CROSS/BLUE SHIELD | Admitting: Obstetrics and Gynecology

## 2020-04-08 ENCOUNTER — Encounter: Payer: Self-pay | Admitting: Obstetrics and Gynecology

## 2020-04-08 ENCOUNTER — Other Ambulatory Visit: Payer: Self-pay

## 2020-04-08 VITALS — BP 110/68 | HR 79 | Temp 98.4°F | Ht 64.0 in | Wt 168.0 lb

## 2020-04-08 DIAGNOSIS — N946 Dysmenorrhea, unspecified: Secondary | ICD-10-CM

## 2020-04-08 DIAGNOSIS — K5900 Constipation, unspecified: Secondary | ICD-10-CM | POA: Diagnosis not present

## 2020-04-08 DIAGNOSIS — N898 Other specified noninflammatory disorders of vagina: Secondary | ICD-10-CM | POA: Diagnosis not present

## 2020-04-08 DIAGNOSIS — R5383 Other fatigue: Secondary | ICD-10-CM | POA: Diagnosis not present

## 2020-04-08 DIAGNOSIS — N939 Abnormal uterine and vaginal bleeding, unspecified: Secondary | ICD-10-CM | POA: Diagnosis not present

## 2020-04-08 DIAGNOSIS — Z113 Encounter for screening for infections with a predominantly sexual mode of transmission: Secondary | ICD-10-CM

## 2020-04-08 LAB — POCT URINE PREGNANCY: Preg Test, Ur: NEGATIVE

## 2020-04-08 MED ORDER — MEDROXYPROGESTERONE ACETATE 10 MG PO TABS: 10.0000 mg | ORAL_TABLET | Freq: Every day | ORAL | 0 refills | Status: DC

## 2020-04-08 NOTE — Progress Notes (Signed)
GYNECOLOGY  VISIT   HPI: 19 y.o.   Single White or Caucasian Not Hispanic or Latino  female   G0P0 with Patient's last menstrual period was 03/15/2020.  And is still spotting. here for irregular cycles and severe cramping patient states that she has not been sexually active in a few months.   Her cycles have always been irregular. The were coming monthly last summer into the fall and spring. Cycles every 26-31 days between June, 2020 and March of 2021 x 3-5 days and normal flow, cramps for one day. One day of her cycle she has trouble functioning, takes NSAID's (helps).  March cycle was 33 days, she bleed x 6 days, heavy, she filled up her menstrual faster than normal. She took plan b prior to that cycle. April was 33 day cycle x 6 days, also heavier than normal.  May was a 30 day cycle, she has been bleeding for 25 days. One day was heavy, some days were moderate. The other days were spotting. Today minimal.  Sexually active, not since early May. Doesn't want contraception at this time. Didn't use condoms (latex allergy).  Finals were at the end of April. Doesn't feel she has been under increased stress. Last semester of school was very stressful. Studying nursing.  She has had some hair loss, fatigue, constipation (diet related). She has lost ~10 lbs since last summer (intentional).   Had Covid in 11/20.   H/o PCOS, prior normal    GYNECOLOGIC HISTORY: Patient's last menstrual period was 03/15/2020. Contraception: none  Menopausal hormone therapy:  None         OB History    Gravida  0   Para      Term      Preterm      AB      Living        SAB      TAB      Ectopic      Multiple      Live Births                 Patient Active Problem List   Diagnosis Date Noted  . Heterozygous for MTHFR gene mutation     Past Medical History:  Diagnosis Date  . Heterozygous for MTHFR gene mutation     Past Surgical History:  Procedure Laterality Date  . NO PAST  SURGERIES      No current outpatient medications on file.   No current facility-administered medications for this visit.     ALLERGIES: Wasp venom  Family History  Problem Relation Age of Onset  . Thyroid disease Mother   . Endometriosis Mother   . Uterine cancer Maternal Grandmother   . Heart disease Maternal Grandfather   . Hypertension Maternal Grandfather   . Hypertension Paternal Grandmother   . Thyroid disease Paternal Grandmother   . ALS Paternal Grandmother   . Leukemia Paternal Grandfather     Social History   Socioeconomic History  . Marital status: Single    Spouse name: Not on file  . Number of children: 0  . Years of education: Not on file  . Highest education level: Not on file  Occupational History  . Not on file  Tobacco Use  . Smoking status: Never Smoker  . Smokeless tobacco: Never Used  Vaping Use  . Vaping Use: Never used  Substance and Sexual Activity  . Alcohol use: No    Alcohol/week: 0.0 standard drinks  . Drug  use: No  . Sexual activity: Yes    Partners: Male  Other Topics Concern  . Not on file  Social History Narrative  . Not on file   Social Determinants of Health   Financial Resource Strain:   . Difficulty of Paying Living Expenses:   Food Insecurity:   . Worried About Programme researcher, broadcasting/film/video in the Last Year:   . Barista in the Last Year:   Transportation Needs:   . Freight forwarder (Medical):   Marland Kitchen Lack of Transportation (Non-Medical):   Physical Activity:   . Days of Exercise per Week:   . Minutes of Exercise per Session:   Stress:   . Feeling of Stress :   Social Connections:   . Frequency of Communication with Friends and Family:   . Frequency of Social Gatherings with Friends and Family:   . Attends Religious Services:   . Active Member of Clubs or Organizations:   . Attends Banker Meetings:   Marland Kitchen Marital Status:   Intimate Partner Violence:   . Fear of Current or Ex-Partner:   .  Emotionally Abused:   Marland Kitchen Physically Abused:   . Sexually Abused:     Review of Systems  All other systems reviewed and are negative.   PHYSICAL EXAMINATION:    BP 110/68   Pulse 79   Temp 98.4 F (36.9 C)   Ht 5\' 4"  (1.626 m)   Wt 168 lb (76.2 kg)   LMP 03/15/2020   SpO2 99%   BMI 28.84 kg/m     General appearance: alert, cooperative and appears stated age Neck: no adenopathy, supple, symmetrical, trachea midline and thyroid normal to inspection and palpation Abdomen: soft, mildly tender just under her umbilicus bilaterally; non distended, no masses,  no organomegaly  Pelvic: External genitalia:  no lesions              Urethra:  normal appearing urethra with no masses, tenderness or lesions              Bartholins and Skenes: normal                 Vagina: normal appearing vagina with a slight increase in thick, white vaginal d/c. Tender with speculum exam.               Cervix: no cervical motion tenderness and no lesions              Bimanual Exam:  Uterus:  anteverted, mobile, +/- tender, normal sized              Adnexa: no mass, fullness, tenderness               Chaperone was present for exam.  ASSESSMENT AUB, seems like an anovulatory bleed Fatigue, constipation Severe dysmenorrhea FH of endometriosis Slight increase in vaginal d/c on exam, on questioning she has noticed a d/c    PLAN UPT negative Provera 10 mg x 10 days STD screening, vaginitis panel CBC, TSH, ferritin Declines OCP's (would need fasting homocysteine level prior).  Information given on the mirena IUD Will use OTC NSAID's F/u for an annual exam after her birthday  ~30 minutes in total patient care

## 2020-04-09 LAB — RPR: RPR Ser Ql: NONREACTIVE

## 2020-04-09 LAB — CBC
Hematocrit: 38 % (ref 34.0–46.6)
Hemoglobin: 12.9 g/dL (ref 11.1–15.9)
MCH: 31.6 pg (ref 26.6–33.0)
MCHC: 33.9 g/dL (ref 31.5–35.7)
MCV: 93 fL (ref 79–97)
Platelets: 374 10*3/uL (ref 150–450)
RBC: 4.08 x10E6/uL (ref 3.77–5.28)
RDW: 12 % (ref 11.7–15.4)
WBC: 9.4 10*3/uL (ref 3.4–10.8)

## 2020-04-09 LAB — HIV ANTIBODY (ROUTINE TESTING W REFLEX): HIV Screen 4th Generation wRfx: NONREACTIVE

## 2020-04-09 LAB — FERRITIN: Ferritin: 107 ng/mL (ref 15–150)

## 2020-04-09 LAB — TSH: TSH: 0.799 u[IU]/mL (ref 0.450–4.500)

## 2020-04-10 LAB — NUSWAB VAGINITIS PLUS (VG+)
Candida albicans, NAA: NEGATIVE
Candida glabrata, NAA: NEGATIVE
Chlamydia trachomatis, NAA: NEGATIVE
Neisseria gonorrhoeae, NAA: NEGATIVE
Trich vag by NAA: NEGATIVE

## 2020-05-26 NOTE — Progress Notes (Deleted)
19 y.o. G0P0 Single White or Caucasian Not Hispanic or Latino female here for annual exam.      No LMP recorded.          Sexually active: {yes no:314532}  The current method of family planning is {contraception:315051}.    Exercising: {yes no:314532}  {types:19826} Smoker:  {YES NO:22349}  Health Maintenance: Pap:  never History of abnormal Pap:  no MMG:  NA BMD:   NA Colonoscopy: NA TDaP:  2011 Gardasil: ***   reports that she has never smoked. She has never used smokeless tobacco. She reports that she does not drink alcohol and does not use drugs.  Past Medical History:  Diagnosis Date   Heterozygous for MTHFR gene mutation     Past Surgical History:  Procedure Laterality Date   NO PAST SURGERIES      Current Outpatient Medications  Medication Sig Dispense Refill   medroxyPROGESTERone (PROVERA) 10 MG tablet Take 1 tablet (10 mg total) by mouth daily. 10 tablet 0   No current facility-administered medications for this visit.    Family History  Problem Relation Age of Onset   Thyroid disease Mother    Endometriosis Mother    Uterine cancer Maternal Grandmother    Heart disease Maternal Grandfather    Hypertension Maternal Grandfather    Hypertension Paternal Grandmother    Thyroid disease Paternal Grandmother    ALS Paternal Grandmother    Leukemia Paternal Grandfather     Review of Systems  Exam:   There were no vitals taken for this visit.  Weight change: @WEIGHTCHANGE @ Height:      Ht Readings from Last 3 Encounters:  04/08/20 5\' 4"  (1.626 m)  01/22/19 5' 3.5" (1.613 m) (38 %, Z= -0.31)*  09/18/18 5' 3.5" (1.613 m) (38 %, Z= -0.31)*   * Growth percentiles are based on CDC (Girls, 2-20 Years) data.    General appearance: alert, cooperative and appears stated age Head: Normocephalic, without obvious abnormality, atraumatic Neck: no adenopathy, supple, symmetrical, trachea midline and thyroid {CHL AMB PHY EX THYROID NORM  DEFAULT:[id number]::"normal to inspection and palpation"} Lungs: clear to auscultation bilaterally Cardiovascular: regular rate and rhythm Breasts: {Exam; breast:13139::"normal appearance, no masses or tenderness"} Abdomen: soft, non-tender; non distended,  no masses,  no organomegaly Extremities: extremities normal, atraumatic, no cyanosis or edema Skin: Skin color, texture, turgor normal. No rashes or lesions Lymph nodes: Cervical, supraclavicular, and axillary nodes normal. No abnormal inguinal nodes palpated Neurologic: Grossly normal   Pelvic: External genitalia:  no lesions              Urethra:  normal appearing urethra with no masses, tenderness or lesions              Bartholins and Skenes: normal                 Vagina: normal appearing vagina with normal color and discharge, no lesions              Cervix: {CHL AMB PHY EX CERVIX NORM [phone]::"no lesions"}               Bimanual Exam:  Uterus:  {CHL AMB PHY EX UTERUS NORM DEFAULT:[phone]::"normal size, contour, position, consistency, mobility, non-tender"}              Adnexa: {CHL AMB PHY EX ADNEXA NO MASS DEFAULT:[phone]::"no mass, fullness, tenderness"}               Rectovaginal: Confirms  Anus:  normal sphincter tone, no lesions  *** chaperoned for the exam.  A:  Well Woman with normal exam  P:

## 2020-05-27 ENCOUNTER — Ambulatory Visit: Payer: Self-pay | Admitting: Obstetrics and Gynecology

## 2020-09-17 ENCOUNTER — Ambulatory Visit: Payer: Self-pay | Admitting: Obstetrics and Gynecology

## 2021-04-17 DIAGNOSIS — K2970 Gastritis, unspecified, without bleeding: Secondary | ICD-10-CM | POA: Insufficient documentation

## 2021-04-17 DIAGNOSIS — R748 Abnormal levels of other serum enzymes: Secondary | ICD-10-CM | POA: Insufficient documentation

## 2021-04-17 DIAGNOSIS — R109 Unspecified abdominal pain: Secondary | ICD-10-CM | POA: Insufficient documentation

## 2021-06-23 NOTE — Progress Notes (Deleted)
20 y.o. G0P0 Single White or Caucasian Not Hispanic or Latino female here for annual exam.      No LMP recorded.          Sexually active: {yes no:314532}  The current method of family planning is {contraception:315051}.    Exercising: {yes no:314532}  {types:19826} Smoker:  {YES NO:22349}  Health Maintenance: Pap:  none History of abnormal Pap:  n/a MMG:  none BMD:   none Colonoscopy: none TDaP:  2011? Gardasil: not done   reports that she has never smoked. She has never used smokeless tobacco. She reports that she does not drink alcohol and does not use drugs.  Past Medical History:  Diagnosis Date   Heterozygous for MTHFR gene mutation     Past Surgical History:  Procedure Laterality Date   NO PAST SURGERIES      Current Outpatient Medications  Medication Sig Dispense Refill   medroxyPROGESTERone (PROVERA) 10 MG tablet Take 1 tablet (10 mg total) by mouth daily. 10 tablet 0   No current facility-administered medications for this visit.    Family History  Problem Relation Age of Onset   Thyroid disease Mother    Endometriosis Mother    Uterine cancer Maternal Grandmother    Heart disease Maternal Grandfather    Hypertension Maternal Grandfather    Hypertension Paternal Grandmother    Thyroid disease Paternal Grandmother    ALS Paternal Grandmother    Leukemia Paternal Grandfather     Review of Systems  Exam:   There were no vitals taken for this visit.  Weight change: @WEIGHTCHANGE @ Height:      Ht Readings from Last 3 Encounters:  04/08/20 5\' 4"  (1.626 m)  01/22/19 5' 3.5" (1.613 m) (38 %, Z= -0.31)*  09/18/18 5' 3.5" (1.613 m) (38 %, Z= -0.31)*   * Growth percentiles are based on CDC (Girls, 2-20 Years) data.    General appearance: alert, cooperative and appears stated age Head: Normocephalic, without obvious abnormality, atraumatic Neck: no adenopathy, supple, symmetrical, trachea midline and thyroid {CHL AMB PHY EX THYROID NORM  [phone]::"normal to inspection and palpation"} Lungs: clear to auscultation bilaterally Cardiovascular: regular rate and rhythm Breasts: {Exam; breast:13139::"normal appearance, no masses or tenderness"} Abdomen: soft, non-tender; non distended,  no masses,  no organomegaly Extremities: extremities normal, atraumatic, no cyanosis or edema Skin: Skin color, texture, turgor normal. No rashes or lesions Lymph nodes: Cervical, supraclavicular, and axillary nodes normal. No abnormal inguinal nodes palpated Neurologic: Grossly normal   Pelvic: External genitalia:  no lesions              Urethra:  normal appearing urethra with no masses, tenderness or lesions              Bartholins and Skenes: normal                 Vagina: normal appearing vagina with normal color and discharge, no lesions              Cervix: {CHL AMB PHY EX CERVIX NORM DEFAULT:[phone]::"no lesions"}               Bimanual Exam:  Uterus:  {CHL AMB PHY EX UTERUS NORM [phone]::"normal size, contour, position, consistency, mobility, non-tender"}              Adnexa: {CHL AMB PHY EX ADNEXA NO MASS DEFAULT:[phone]::"no mass, fullness, tenderness"}               Rectovaginal: Confirms  Anus:  normal sphincter tone, no lesions  *** chaperoned for the exam.  A:  Well Woman with normal exam  P:

## 2021-06-26 ENCOUNTER — Ambulatory Visit: Payer: Self-pay | Admitting: Obstetrics and Gynecology

## 2021-09-21 ENCOUNTER — Encounter: Payer: Self-pay | Admitting: Nurse Practitioner

## 2021-09-21 ENCOUNTER — Other Ambulatory Visit: Payer: Self-pay

## 2021-09-21 ENCOUNTER — Ambulatory Visit (INDEPENDENT_AMBULATORY_CARE_PROVIDER_SITE_OTHER): Payer: BC Managed Care – PPO | Admitting: Nurse Practitioner

## 2021-09-21 VITALS — BP 112/66

## 2021-09-21 DIAGNOSIS — N898 Other specified noninflammatory disorders of vagina: Secondary | ICD-10-CM

## 2021-09-21 DIAGNOSIS — Z113 Encounter for screening for infections with a predominantly sexual mode of transmission: Secondary | ICD-10-CM | POA: Diagnosis not present

## 2021-09-21 NOTE — Progress Notes (Signed)
   Acute Office Visit  Subjective:    Patient ID: Tamara Sutton, female    DOB: 18-Nov-2000, 20 y.o.   MRN: [id number]   HPI 20 y.o. presents today for STD screening. She self-treated for a yeast infection with cream she had leftover at home last week. Felt symptoms somewhat improved but she is still experiencing mild symptoms. Declines HIV/RPR.    Review of Systems  Constitutional: Negative.   Genitourinary:  Negative for vaginal discharge.       Vaginal itching      Objective:    Physical Exam Constitutional:      Appearance: Normal appearance.  Genitourinary:    General: Normal vulva.     Vagina: Normal.     Cervix: Normal.    BP 112/66   LMP 09/04/2021  Wt Readings from Last 3 Encounters:  04/08/20 168 lb (76.2 kg)  01/22/19 178 lb (80.7 kg) (94 %, Z= 1.56)*  09/18/18 179 lb (81.2 kg) (94 %, Z= 1.59)*   * Growth percentiles are based on CDC (Girls, 2-20 Years) data.        Assessment & Plan:   Problem List Items Addressed This Visit   None Visit Diagnoses     Screen for STD (sexually transmitted disease)    -  Primary   Relevant Orders   SureSwab Advanced Vaginitis Plus,TMA   Vaginal itching       Relevant Orders   SureSwab Advanced Vaginitis Plus,TMA      Plan: STD/vaginitis panel pending. Will triage based on results. She is agreeable to plan.      Olivia Mackie DNP, 4:19 PM 09/21/2021

## 2021-09-29 LAB — SURESWAB® ADVANCED VAGINITIS PLUS,TMA
C. trachomatis RNA, TMA: NOT DETECTED
CANDIDA SPECIES: NOT DETECTED
Candida glabrata: NOT DETECTED
N. gonorrhoeae RNA, TMA: NOT DETECTED
SURESWAB(R) ADV BACTERIAL VAGINOSIS(BV),TMA: NEGATIVE
TRICHOMONAS VAGINALIS (TV),TMA: NOT DETECTED

## 2022-05-10 NOTE — Progress Notes (Signed)
21 y.o. G0P0 Single White or Caucasian Not Hispanic or Latino female here for annual exam. Same partner since last June. Using Rhythm and w/d for contraception. She reports intermittent, deep dyspareunia, positional.  Period Cycle (Days): 28 Period Duration (Days): 6 Period Pattern: Regular Menstrual Flow: Light Menstrual Control: Maxi pad Menstrual Control Change Freq (Hours): 6 Dysmenorrhea: (!) Moderate (normally just one day) Dysmenorrhea Symptoms: Cramping, Diarrhea, Nausea  She c/o weight gain, 20 lbs in the last 6 months.   Patient's last menstrual period was 05/02/2022.          Sexually active: Yes.    The current method of family planning is none.    Exercising: Yes.    Gym/ health club routine includes cardio and mod to heavy weightlifting. Smoker:  no  Health Maintenance: Pap:  2022 abnormal was told to repeat in one year. She signed a release.  History of abnormal Pap:  no MMG:  none  BMD:   none  Colonoscopy: none  TDaP:  2011 Gardasil: no   reports that she has never smoked. She has never used smokeless tobacco. She reports that she does not drink alcohol and does not use drugs. 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 Will be working on L&D.   Past Medical History:  Diagnosis Date   Heterozygous for MTHFR gene mutation     Past Surgical History:  Procedure Laterality Date   NO PAST SURGERIES      Current Outpatient Medications  Medication Sig Dispense Refill   gabapentin (NEURONTIN) 300 MG capsule Take by mouth.     No current facility-administered medications for this visit.    Family History  Problem Relation Age of Onset   Thyroid disease Mother    Endometriosis Mother    Uterine cancer Maternal Grandmother    Heart disease Maternal Grandfather    Hypertension Maternal Grandfather    Hypertension Paternal Grandmother    Thyroid disease Paternal Grandmother    ALS Paternal Grandmother    Leukemia Paternal Grandfather     Review of Systems   Genitourinary:  Positive for vaginal pain.  All other systems reviewed and are negative.   Exam:   BP 110/64   Pulse 66   Ht 5\' 4"  (1.626 m)   Wt 191 lb (86.6 kg)   LMP 05/02/2022   SpO2 99%   BMI 32.79 kg/m   Weight change: @WEIGHTCHANGE @ Height:   Height: 5\' 4"  (162.6 cm)  Ht Readings from Last 3 Encounters:  05/13/22 5\' 4"  (1.626 m)  04/08/20 5\' 4"  (1.626 m)  01/22/19 5' 3.5" (1.613 m) (38 %, Z= -0.31)*   * Growth percentiles are based on CDC (Girls, 2-20 Years) data.    General appearance: alert, cooperative and appears stated age Head: Normocephalic, without obvious abnormality, atraumatic Neck: no adenopathy, supple, symmetrical, trachea midline and thyroid normal to inspection and palpation Lungs: clear to auscultation bilaterally Cardiovascular: regular rate and rhythm Breasts: normal appearance, no masses or tenderness Abdomen: soft, non-tender; non distended,  no masses,  no organomegaly Extremities: extremities normal, atraumatic, no cyanosis or edema Skin: Skin color, texture, turgor normal. No rashes or lesions Lymph nodes: Cervical, supraclavicular, and axillary nodes normal. No abnormal inguinal nodes palpated Neurologic: Grossly normal   Pelvic: External genitalia:  no lesions              Urethra:  normal appearing urethra with no masses, tenderness or lesions  Bartholins and Skenes: normal                 Vagina: normal appearing vagina with an increase in frothy, thin, white vaginal discharge              Cervix: no lesions               Bimanual Exam:  Uterus:  normal size, contour, position, consistency, mobility, non-tender              Adnexa: no mass, fullness, tenderness               Rectovaginal: Confirms               Anus:  normal sphincter tone, no lesions  Carolynn Serve, CMA chaperoned for the exam.  On questioning during the exam the patient did report mild vaginal itching, she noticed a slight d/c the other day, no  odor.  1. Well woman exam Discussed breast self exam Discussed calcium and vit D intake Declines screening labs Declines birth control  2. Screening for cervical cancer - Cytology - PAP  3. Screening examination for STD (sexually transmitted disease) Declines blood work - Cytology - PAP  4. Weight gain - TSH  5. Vaginal discharge - WET PREP FOR TRICH, YEAST, CLUE

## 2022-05-13 ENCOUNTER — Other Ambulatory Visit (HOSPITAL_COMMUNITY)
Admission: RE | Admit: 2022-05-13 | Discharge: 2022-05-13 | Disposition: A | Discharge status: Home / Self Care | Payer: BC Managed Care – PPO | Source: Ambulatory Visit | Attending: Obstetrics and Gynecology | Admitting: Obstetrics and Gynecology

## 2022-05-13 ENCOUNTER — Ambulatory Visit (INDEPENDENT_AMBULATORY_CARE_PROVIDER_SITE_OTHER): Payer: BC Managed Care – PPO | Admitting: Obstetrics and Gynecology

## 2022-05-13 ENCOUNTER — Encounter: Payer: Self-pay | Admitting: Obstetrics and Gynecology

## 2022-05-13 VITALS — BP 110/64 | HR 66 | Ht 64.0 in | Wt 191.0 lb

## 2022-05-13 DIAGNOSIS — Z124 Encounter for screening for malignant neoplasm of cervix: Secondary | ICD-10-CM | POA: Diagnosis present

## 2022-05-13 DIAGNOSIS — Z01419 Encounter for gynecological examination (general) (routine) without abnormal findings: Secondary | ICD-10-CM

## 2022-05-13 DIAGNOSIS — Z113 Encounter for screening for infections with a predominantly sexual mode of transmission: Secondary | ICD-10-CM | POA: Diagnosis present

## 2022-05-13 DIAGNOSIS — N760 Acute vaginitis: Secondary | ICD-10-CM

## 2022-05-13 DIAGNOSIS — N898 Other specified noninflammatory disorders of vagina: Secondary | ICD-10-CM | POA: Diagnosis not present

## 2022-05-13 DIAGNOSIS — R635 Abnormal weight gain: Secondary | ICD-10-CM | POA: Diagnosis not present

## 2022-05-13 DIAGNOSIS — B9689 Other specified bacterial agents as the cause of diseases classified elsewhere: Secondary | ICD-10-CM

## 2022-05-13 LAB — WET PREP FOR TRICH, YEAST, CLUE

## 2022-05-13 MED ORDER — METRONIDAZOLE 500 MG PO TABS: 500.0000 mg | ORAL_TABLET | Freq: Two times a day (BID) | ORAL | 0 refills | Status: DC

## 2022-05-13 NOTE — Patient Instructions (Signed)
EXERCISE   We recommended that you start or continue a regular exercise program for good health. Physical activity is anything that gets your body moving, some is better than none. The CDC recommends 150 minutes per week of Moderate-Intensity Aerobic Activity and 2 or more days of Muscle Strengthening Activity.  Benefits of exercise are limitless: helps weight loss/weight maintenance, improves mood and energy, helps with depression and anxiety, improves sleep, tones and strengthens muscles, improves balance, improves bone density, protects from chronic conditions such as heart disease, high blood pressure and diabetes and so much more. To learn more visit: https://www.cdc.gov/physicalactivity/index.html  DIET: Good nutrition starts with a healthy diet of fruits, vegetables, whole grains, and lean protein sources. Drink plenty of water for hydration. Minimize empty calories, sodium, sweets. For more information about dietary recommendations visit: https://health.gov/our-work/nutrition-physical-activity/dietary-guidelines and https://www.myplate.gov/  ALCOHOL:  Women should limit their alcohol intake to no more than 7 drinks/beers/glasses of wine (combined, not each!) per week. Moderation of alcohol intake to this level decreases your risk of breast cancer and liver damage.  If you are concerned that you may have a problem, or your friends have told you they are concerned about your drinking, there are many resources to help. A well-known program that is free, effective, and available to all people all over the nation is Alcoholics Anonymous.  Check out this site to learn more: https://www.aa.org/   CALCIUM AND VITAMIN D:  Adequate intake of calcium and Vitamin D are recommended for bone health.  You should be getting between 1000-1200 mg of calcium and 800 units of Vitamin D daily between diet and supplements  PAP SMEARS:  Pap smears, to check for cervical cancer or precancers,  have traditionally been  done yearly, scientific advances have shown that most women can have pap smears less often.  However, every woman still should have a physical exam from her gynecologist every year. It will include a breast check, inspection of the vulva and vagina to check for abnormal growths or skin changes, a visual exam of the cervix, and then an exam to evaluate the size and shape of the uterus and ovaries. We will also provide age appropriate advice regarding health maintenance, like when you should have certain vaccines, screening for sexually transmitted diseases, bone density testing, colonoscopy, mammograms, etc.   MAMMOGRAMS:  All women over 40 years old should have a routine mammogram.   COLON CANCER SCREENING: Now recommend starting at age 45. At this time colonoscopy is not covered for routine screening until 50. There are take home tests that can be done between 45-49.   COLONOSCOPY:  Colonoscopy to screen for colon cancer is recommended for all women at age 50.  We know, you hate the idea of the prep.  We agree, BUT, having colon cancer and not knowing it is worse!!  Colon cancer so often starts as a polyp that can be seen and removed at colonscopy, which can quite literally save your life!  And if your first colonoscopy is normal and you have no family history of colon cancer, most women don't have to have it again for 10 years.  Once every ten years, you can do something that may end up saving your life, right?  We will be happy to help you get it scheduled when you are ready.  Be sure to check your insurance coverage so you understand how much it will cost.  It may be covered as a preventative service at no cost, but you should check   your particular policy.      Breast Self-Awareness Breast self-awareness means being familiar with how your breasts look and feel. It involves checking your breasts regularly and reporting any changes to your health care provider. Practicing breast self-awareness is  important. A change in your breasts can be a sign of a serious medical problem. Being familiar with how your breasts look and feel allows you to find any problems early, when treatment is more likely to be successful. All women should practice breast self-awareness, including women who have had breast implants. How to do a breast self-exam One way to learn what is normal for your breasts and whether your breasts are changing is to do a breast self-exam. To do a breast self-exam: Look for Changes  Remove all the clothing above your waist. Stand in front of a mirror in a room with good lighting. Put your hands on your hips. Push your hands firmly downward. Compare your breasts in the mirror. Look for differences between them (asymmetry), such as: Differences in shape. Differences in size. Puckers, dips, and bumps in one breast and not the other. Look at each breast for changes in your skin, such as: Redness. Scaly areas. Look for changes in your nipples, such as: Discharge. Bleeding. Dimpling. Redness. A change in position. Feel for Changes Carefully feel your breasts for lumps and changes. It is best to do this while lying on your back on the floor and again while sitting or standing in the shower or tub with soapy water on your skin. Feel each breast in the following way: Place the arm on the side of the breast you are examining above your head. Feel your breast with the other hand. Start in the nipple area and make  inch (2 cm) overlapping circles to feel your breast. Use the pads of your three middle fingers to do this. Apply light pressure, then medium pressure, then firm pressure. The light pressure will allow you to feel the tissue closest to the skin. The medium pressure will allow you to feel the tissue that is a little deeper. The firm pressure will allow you to feel the tissue close to the ribs. Continue the overlapping circles, moving downward over the breast until you feel your  ribs below your breast. Move one finger-width toward the center of the body. Continue to use the  inch (2 cm) overlapping circles to feel your breast as you move slowly up toward your collarbone. Continue the up and down exam using all three pressures until you reach your armpit.  Write Down What You Find  Write down what is normal for each breast and any changes that you find. Keep a written record with breast changes or normal findings for each breast. By writing this information down, you do not need to depend only on memory for size, tenderness, or location. Write down where you are in your menstrual cycle, if you are still menstruating. If you are having trouble noticing differences in your breasts, do not get discouraged. With time you will become more familiar with the variations in your breasts and more comfortable with the exam. How often should I examine my breasts? Examine your breasts every month. If you are breastfeeding, the best time to examine your breasts is after a feeding or after using a breast pump. If you menstruate, the best time to examine your breasts is 5-7 days after your period is over. During your period, your breasts are lumpier, and it may be more   difficult to notice changes. When should I see my health care provider? See your health care provider if you notice: A change in shape or size of your breasts or nipples. A change in the skin of your breast or nipples, such as a reddened or scaly area. Unusual discharge from your nipples. A lump or thick area that was not there before. Pain in your breasts. Anything that concerns you. Bacterial Vaginosis  Bacterial vaginosis is an infection that occurs when the normal balance of bacteria in the vagina changes. This change is caused by an overgrowth of certain bacteria in the vagina. Bacterial vaginosis is the most common vaginal infection among females aged 15 to 44 years. This condition increases the risk of sexually  transmitted infections (STIs). Treatment can help reduce this risk. Treatment is very important for pregnant women because this condition can cause babies to be born early (prematurely) or at a low birth weight. What are the causes? This condition is caused by an increase in harmful bacteria that are normally present in small amounts in the vagina. However, the exact reason this condition develops is not known. You cannot get bacterial vaginosis from toilet seats, bedding, swimming pools, or contact with objects around you. What increases the risk? The following factors may make you more likely to develop this condition: Having a new sexual partner or multiple sexual partners, or having unprotected sex. Douching. Having an intrauterine device (IUD). Smoking. Abusing drugs and alcohol. This may lead to riskier sexual behavior. Taking certain antibiotic medicines. Being pregnant. What are the signs or symptoms? Some women with this condition have no symptoms. Symptoms may include: Gray or white vaginal discharge. The discharge can be watery or foamy. A fish-like odor with discharge, especially after sex or during menstruation. Itching in and around the vagina. Burning or pain with urination. How is this diagnosed? This condition is diagnosed based on: Your medical history. A physical exam of the vagina. Checking a sample of vaginal fluid for harmful bacteria or abnormal cells. How is this treated? This condition is treated with antibiotic medicines. These may be given as a pill, a vaginal cream, or a medicine that is put into the vagina (suppository). If the condition comes back after treatment, a second round of antibiotics may be needed. Follow these instructions at home: Medicines Take or apply over-the-counter and prescription medicines only as told by your health care provider. Take or apply your antibiotic medicine as told by your health care provider. Do not stop using the  antibiotic even if you start to feel better. General instructions If you have a female sexual partner, tell her that you have a vaginal infection. She should follow up with her health care provider. If you have a female sexual partner, he does not need treatment. Avoid sexual activity until you finish treatment. Drink enough fluid to keep your urine pale yellow. Keep the area around your vagina and rectum clean. Wash the area daily with warm water. Wipe yourself from front to back after using the toilet. If you are breastfeeding, talk to your health care provider about continuing breastfeeding during treatment. Keep all follow-up visits. This is important. How is this prevented? Self-care Do not douche. Wash the outside of your vagina with warm water only. Wear cotton or cotton-lined underwear. Avoid wearing tight pants and pantyhose, especially during the summer. Safe sex Use protection when having sex. This includes: Using condoms. Using dental dams. This is a thin layer of a material made of latex   or polyurethane that protects the mouth during oral sex. Limit the number of sexual partners. To help prevent bacterial vaginosis, it is best to have sex with just one partner (monogamous relationship). Make sure you and your sexual partner are tested for STIs. Drugs and alcohol Do not use any products that contain nicotine or tobacco. These products include cigarettes, chewing tobacco, and vaping devices, such as e-cigarettes. If you need help quitting, ask your health care provider. Do not use drugs. Do not drink alcohol if: Your health care provider tells you not to do this. You are pregnant, may be pregnant, or are planning to become pregnant. If you drink alcohol: Limit how much you have to 0-1 drink a day. Be aware of how much alcohol is in your drink. In the [place] one drink equals one 12 oz bottle of beer (355 mL), one 5 oz glass of wine (148 mL), or one 1 oz glass of hard liquor (44  mL). Where to find more information Centers for Disease Control and Prevention: www.cdc.gov American Sexual Health Association (ASHA): www.ashastd.org [place] Department of Health and Human Services, Office on Women's Health: www.womenshealth.gov Contact a health care provider if: Your symptoms do not improve, even after treatment. You have more discharge or pain when urinating. You have a fever or chills. You have pain in your abdomen or pelvis. You have pain during sex. You have vaginal bleeding between menstrual periods. Summary Bacterial vaginosis is a vaginal infection that occurs when the normal balance of bacteria in the vagina changes. It results from an overgrowth of certain bacteria. This condition increases the risk of sexually transmitted infections (STIs). Getting treated can help reduce this risk. Treatment is very important for pregnant women because this condition can cause babies to be born early (prematurely) or at low birth weight. This condition is treated with antibiotic medicines. These may be given as a pill, a vaginal cream, or a medicine that is put into the vagina (suppository). This information is not intended to replace advice given to you by your health care provider. Make sure you discuss any questions you have with your health care provider. Document Revised: 04/10/2020 Document Reviewed: 04/10/2020 Elsevier Patient Education  2023 [employer] Inc.  

## 2022-05-14 LAB — CYTOLOGY - PAP
Chlamydia: NEGATIVE
Comment: NEGATIVE
Comment: NEGATIVE
Comment: NORMAL
Diagnosis: NEGATIVE
Neisseria Gonorrhea: NEGATIVE
Trichomonas: NEGATIVE

## 2022-05-14 LAB — TSH: TSH: 1.33 mIU/L

## 2022-10-21 ENCOUNTER — Ambulatory Visit (INDEPENDENT_AMBULATORY_CARE_PROVIDER_SITE_OTHER): Payer: BC Managed Care – PPO | Admitting: Radiology

## 2022-10-21 VITALS — BP 116/70

## 2022-10-21 DIAGNOSIS — Z113 Encounter for screening for infections with a predominantly sexual mode of transmission: Secondary | ICD-10-CM

## 2022-10-21 NOTE — Progress Notes (Signed)
      Subjective: Tamara Sutton is a 21 y.o. female here for STI screen. Asymptomatic, has had 2 new partners since last STI screen.    Review of Systems  All other systems reviewed and are negative.   Past Medical History:  Diagnosis Date   Heterozygous for MTHFR gene mutation       Objective:  Today's Vitals   10/21/22 1332  BP: 116/70   There is no height or weight on file to calculate BMI.   -General: no acute distress -Vulva: without lesions or discharge -Vagina: discharge present, aptima swab obtained -Cervix: no lesion or discharge, no CMT -Perineum: no lesions -Uterus: Mobile, non tender -Adnexa: no masses or tenderness  Chaperone offered and declined.  Assessment:/Plan:   1. Screening for STDs (sexually transmitted diseases)  - HIV antibody (with reflex) - RPR - Hepatitis B Surface AntiGEN - Hepatitis C antibody - SURESWAB CT/NG/T. vaginalis    Will contact patient with results of testing completed today. Avoid intercourse until symptoms are resolved. Safe sex encouraged. Avoid the use of soaps or perfumed products in the peri area. Avoid tub baths and sitting in sweaty or wet clothing for prolonged periods of time.

## 2022-10-22 LAB — RPR: RPR Ser Ql: NONREACTIVE

## 2022-10-22 LAB — SURESWAB CT/NG/T. VAGINALIS
C. trachomatis RNA, TMA: NOT DETECTED
N. gonorrhoeae RNA, TMA: NOT DETECTED
Trichomonas vaginalis RNA: NOT DETECTED

## 2022-10-22 LAB — HIV ANTIBODY (ROUTINE TESTING W REFLEX): HIV 1&2 Ab, 4th Generation: NONREACTIVE

## 2022-10-22 LAB — HEPATITIS B SURFACE ANTIGEN: Hepatitis B Surface Ag: NONREACTIVE

## 2022-10-22 LAB — HEPATITIS C ANTIBODY: Hepatitis C Ab: NONREACTIVE

## 2023-10-26 NOTE — L&D Delivery Note (Signed)
 Delivery Note At 8:44 AM a viable and healthy female was delivered via Vaginal, Spontaneous (Presentation: Right Occiput Anterior). Right hand compound  APGAR: 7, 9; weight  pending.   Placenta status: Spontaneous;Pathology, Intact. Marginal cord,  Cord:  CAN x 1 reducible 3 vessels with the following complications: None.  Cord pH: none  Anesthesia: Epidural;Local Episiotomy: None Lacerations: 2nd degree perineal Suture Repair: 3.0 chromic Est. Blood Loss (mL):    Mom to postpartum.  Baby to Couplet care / Skin to Skin.  Saadiq Poche A Linnea Todisco 10/13/2024, 9:33 AM

## 2024-04-19 DIAGNOSIS — M5386 Other specified dorsopathies, lumbar region: Secondary | ICD-10-CM | POA: Diagnosis not present

## 2024-04-19 DIAGNOSIS — M5382 Other specified dorsopathies, cervical region: Secondary | ICD-10-CM | POA: Diagnosis not present

## 2024-04-19 DIAGNOSIS — M9903 Segmental and somatic dysfunction of lumbar region: Secondary | ICD-10-CM | POA: Diagnosis not present

## 2024-04-19 DIAGNOSIS — M9901 Segmental and somatic dysfunction of cervical region: Secondary | ICD-10-CM | POA: Diagnosis not present

## 2024-04-23 DIAGNOSIS — M9901 Segmental and somatic dysfunction of cervical region: Secondary | ICD-10-CM | POA: Diagnosis not present

## 2024-04-23 DIAGNOSIS — M5382 Other specified dorsopathies, cervical region: Secondary | ICD-10-CM | POA: Diagnosis not present

## 2024-04-23 DIAGNOSIS — M9903 Segmental and somatic dysfunction of lumbar region: Secondary | ICD-10-CM | POA: Diagnosis not present

## 2024-04-23 DIAGNOSIS — M5386 Other specified dorsopathies, lumbar region: Secondary | ICD-10-CM | POA: Diagnosis not present

## 2024-04-23 LAB — HEPATITIS C ANTIBODY: HCV Ab: NEGATIVE

## 2024-04-23 LAB — OB RESULTS CONSOLE RPR: RPR: NONREACTIVE

## 2024-04-23 LAB — OB RESULTS CONSOLE ANTIBODY SCREEN: Antibody Screen: NEGATIVE

## 2024-04-23 LAB — OB RESULTS CONSOLE GC/CHLAMYDIA
Chlamydia: NEGATIVE
Neisseria Gonorrhea: NEGATIVE

## 2024-04-23 LAB — OB RESULTS CONSOLE HEPATITIS B SURFACE ANTIGEN: Hepatitis B Surface Ag: NEGATIVE

## 2024-04-23 LAB — OB RESULTS CONSOLE RUBELLA ANTIBODY, IGM: Rubella: IMMUNE

## 2024-04-23 LAB — OB RESULTS CONSOLE HIV ANTIBODY (ROUTINE TESTING): HIV: NONREACTIVE

## 2024-04-26 DIAGNOSIS — Z3A15 15 weeks gestation of pregnancy: Secondary | ICD-10-CM | POA: Diagnosis not present

## 2024-04-26 DIAGNOSIS — Z3400 Encounter for supervision of normal first pregnancy, unspecified trimester: Secondary | ICD-10-CM | POA: Diagnosis not present

## 2024-04-26 DIAGNOSIS — M9903 Segmental and somatic dysfunction of lumbar region: Secondary | ICD-10-CM | POA: Diagnosis not present

## 2024-04-26 DIAGNOSIS — M5382 Other specified dorsopathies, cervical region: Secondary | ICD-10-CM | POA: Diagnosis not present

## 2024-04-26 DIAGNOSIS — M9901 Segmental and somatic dysfunction of cervical region: Secondary | ICD-10-CM | POA: Diagnosis not present

## 2024-04-26 DIAGNOSIS — Z3482 Encounter for supervision of other normal pregnancy, second trimester: Secondary | ICD-10-CM | POA: Diagnosis not present

## 2024-04-26 DIAGNOSIS — Z3A16 16 weeks gestation of pregnancy: Secondary | ICD-10-CM | POA: Diagnosis not present

## 2024-04-26 DIAGNOSIS — M5386 Other specified dorsopathies, lumbar region: Secondary | ICD-10-CM | POA: Diagnosis not present

## 2024-04-30 DIAGNOSIS — M9903 Segmental and somatic dysfunction of lumbar region: Secondary | ICD-10-CM | POA: Diagnosis not present

## 2024-04-30 DIAGNOSIS — M9901 Segmental and somatic dysfunction of cervical region: Secondary | ICD-10-CM | POA: Diagnosis not present

## 2024-04-30 DIAGNOSIS — M5386 Other specified dorsopathies, lumbar region: Secondary | ICD-10-CM | POA: Diagnosis not present

## 2024-04-30 DIAGNOSIS — M5382 Other specified dorsopathies, cervical region: Secondary | ICD-10-CM | POA: Diagnosis not present

## 2024-05-03 DIAGNOSIS — Z113 Encounter for screening for infections with a predominantly sexual mode of transmission: Secondary | ICD-10-CM | POA: Diagnosis not present

## 2024-05-03 DIAGNOSIS — M9903 Segmental and somatic dysfunction of lumbar region: Secondary | ICD-10-CM | POA: Diagnosis not present

## 2024-05-03 DIAGNOSIS — Z369 Encounter for antenatal screening, unspecified: Secondary | ICD-10-CM | POA: Diagnosis not present

## 2024-05-03 DIAGNOSIS — Z124 Encounter for screening for malignant neoplasm of cervix: Secondary | ICD-10-CM | POA: Diagnosis not present

## 2024-05-03 DIAGNOSIS — Z3400 Encounter for supervision of normal first pregnancy, unspecified trimester: Secondary | ICD-10-CM | POA: Diagnosis not present

## 2024-05-03 DIAGNOSIS — Z3A16 16 weeks gestation of pregnancy: Secondary | ICD-10-CM | POA: Diagnosis not present

## 2024-05-03 DIAGNOSIS — M5386 Other specified dorsopathies, lumbar region: Secondary | ICD-10-CM | POA: Diagnosis not present

## 2024-05-03 DIAGNOSIS — Z3482 Encounter for supervision of other normal pregnancy, second trimester: Secondary | ICD-10-CM | POA: Diagnosis not present

## 2024-05-03 DIAGNOSIS — M5382 Other specified dorsopathies, cervical region: Secondary | ICD-10-CM | POA: Diagnosis not present

## 2024-05-03 DIAGNOSIS — M9901 Segmental and somatic dysfunction of cervical region: Secondary | ICD-10-CM | POA: Diagnosis not present

## 2024-05-07 DIAGNOSIS — M9901 Segmental and somatic dysfunction of cervical region: Secondary | ICD-10-CM | POA: Diagnosis not present

## 2024-05-07 DIAGNOSIS — M5386 Other specified dorsopathies, lumbar region: Secondary | ICD-10-CM | POA: Diagnosis not present

## 2024-05-07 DIAGNOSIS — M5382 Other specified dorsopathies, cervical region: Secondary | ICD-10-CM | POA: Diagnosis not present

## 2024-05-07 DIAGNOSIS — M9903 Segmental and somatic dysfunction of lumbar region: Secondary | ICD-10-CM | POA: Diagnosis not present

## 2024-05-09 DIAGNOSIS — M9903 Segmental and somatic dysfunction of lumbar region: Secondary | ICD-10-CM | POA: Diagnosis not present

## 2024-05-09 DIAGNOSIS — M9901 Segmental and somatic dysfunction of cervical region: Secondary | ICD-10-CM | POA: Diagnosis not present

## 2024-05-09 DIAGNOSIS — M5386 Other specified dorsopathies, lumbar region: Secondary | ICD-10-CM | POA: Diagnosis not present

## 2024-05-09 DIAGNOSIS — M5382 Other specified dorsopathies, cervical region: Secondary | ICD-10-CM | POA: Diagnosis not present

## 2024-05-14 ENCOUNTER — Ambulatory Visit: Attending: Obstetrics and Gynecology | Admitting: Physical Therapy

## 2024-05-14 ENCOUNTER — Other Ambulatory Visit: Payer: Self-pay

## 2024-05-14 ENCOUNTER — Encounter: Payer: Self-pay | Admitting: Physical Therapy

## 2024-05-14 DIAGNOSIS — M6281 Muscle weakness (generalized): Secondary | ICD-10-CM | POA: Diagnosis not present

## 2024-05-14 DIAGNOSIS — M5386 Other specified dorsopathies, lumbar region: Secondary | ICD-10-CM | POA: Diagnosis not present

## 2024-05-14 DIAGNOSIS — M9901 Segmental and somatic dysfunction of cervical region: Secondary | ICD-10-CM | POA: Diagnosis not present

## 2024-05-14 DIAGNOSIS — M5459 Other low back pain: Secondary | ICD-10-CM | POA: Insufficient documentation

## 2024-05-14 DIAGNOSIS — M5382 Other specified dorsopathies, cervical region: Secondary | ICD-10-CM | POA: Diagnosis not present

## 2024-05-14 DIAGNOSIS — M9903 Segmental and somatic dysfunction of lumbar region: Secondary | ICD-10-CM | POA: Diagnosis not present
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
## 2024-05-16 ENCOUNTER — Ambulatory Visit: Admitting: Physical Therapy

## 2024-05-16 ENCOUNTER — Encounter: Payer: Self-pay | Admitting: Physical Therapy

## 2024-05-16 DIAGNOSIS — M5459 Other low back pain: Secondary | ICD-10-CM

## 2024-05-16 DIAGNOSIS — M6281 Muscle weakness (generalized): Secondary | ICD-10-CM
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
## 2024-05-21 DIAGNOSIS — M9901 Segmental and somatic dysfunction of cervical region: Secondary | ICD-10-CM | POA: Diagnosis not present

## 2024-05-21 DIAGNOSIS — M5386 Other specified dorsopathies, lumbar region: Secondary | ICD-10-CM | POA: Diagnosis not present

## 2024-05-21 DIAGNOSIS — M9903 Segmental and somatic dysfunction of lumbar region: Secondary | ICD-10-CM | POA: Diagnosis not present

## 2024-05-21 DIAGNOSIS — M5382 Other specified dorsopathies, cervical region: Secondary | ICD-10-CM | POA: Diagnosis not present

## 2024-05-25 ENCOUNTER — Inpatient Hospital Stay (HOSPITAL_COMMUNITY)
Admission: AD | Admit: 2024-05-25 | Discharge: 2024-05-26 | Disposition: A | Discharge status: Home / Self Care | Attending: Obstetrics and Gynecology | Admitting: Obstetrics and Gynecology

## 2024-05-25 ENCOUNTER — Ambulatory Visit: Attending: Obstetrics and Gynecology | Admitting: Physical Therapy

## 2024-05-25 DIAGNOSIS — M79669 Pain in unspecified lower leg: Secondary | ICD-10-CM | POA: Insufficient documentation

## 2024-05-25 DIAGNOSIS — M5459 Other low back pain: Secondary | ICD-10-CM | POA: Diagnosis not present

## 2024-05-25 DIAGNOSIS — M6281 Muscle weakness (generalized): Secondary | ICD-10-CM | POA: Diagnosis not present

## 2024-05-25 DIAGNOSIS — I8390 Asymptomatic varicose veins of unspecified lower extremity: Secondary | ICD-10-CM | POA: Insufficient documentation

## 2024-05-25 DIAGNOSIS — O26892 Other specified pregnancy related conditions, second trimester: Secondary | ICD-10-CM | POA: Insufficient documentation

## 2024-05-25 DIAGNOSIS — O2202 Varicose veins of lower extremity in pregnancy, second trimester: Secondary | ICD-10-CM | POA: Insufficient documentation

## 2024-05-25 DIAGNOSIS — Z3A20 20 weeks gestation of pregnancy: Secondary | ICD-10-CM | POA: Insufficient documentation
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
## 2024-05-25 NOTE — Patient Instructions (Signed)
  THE KNACK  The Knack is a strategy you may use to help to reduce or prevent leakage or passing of urine, gas or feces during an activity that causes downward force on the pelvic floor muscles.    Activities that can cause downward pressure on the pelvic floor muscles include coughing, sneezing, laughing, bending, lifting, and transitioning from different body positions such as from laying down to sitting up and sitting to standing.  To perform The Knack, consciously squeeze and lift your pelvic floor muscles to perform a strong, well-timed pelvic muscle contraction BEFORE AND DURING these activities above.  As your contraction gets more coordinated and your muscles get stronger, you will become more effective in controlling your experience of incontinence or gas passing during these activities.    Toileting Mechanics How to poop: Sit all the way down on the toilet with your feet flat on the floor or a stool (try a stool or squatty potty) Avoid straining if you can - but if you need to, this is how to do it safely: Imagine you are fogging up a mirror with your breath as you exhale and gently push the stool down and out  Try doing a potty dance to see if you can relax further  How to pee: Sit all the way down, feet flat  No pushing the urine out - try to let it flow naturally  When you think you're done peeing, try taking 3 deep breaths to see if you can further relax your urethral sphincter  When you are done peeing, gently try going again (this is called double voiding)

## 2024-05-26 ENCOUNTER — Encounter (HOSPITAL_COMMUNITY): Payer: Self-pay | Admitting: *Deleted

## 2024-05-26 DIAGNOSIS — O2202 Varicose veins of lower extremity in pregnancy, second trimester: Secondary | ICD-10-CM | POA: Diagnosis not present

## 2024-05-26 DIAGNOSIS — M79669 Pain in unspecified lower leg: Secondary | ICD-10-CM | POA: Diagnosis not present

## 2024-05-26 DIAGNOSIS — I8390 Asymptomatic varicose veins of unspecified lower extremity: Secondary | ICD-10-CM | POA: Diagnosis not present

## 2024-05-26 DIAGNOSIS — Z3A20 20 weeks gestation of pregnancy: Secondary | ICD-10-CM | POA: Diagnosis not present

## 2024-05-26 DIAGNOSIS — O26892 Other specified pregnancy related conditions, second trimester: Secondary | ICD-10-CM | POA: Diagnosis not present

## 2024-05-26 NOTE — MAU Provider Note (Signed)
 History     CSN: [id number]  Arrival date and time: 05/25/24 2357   Event Date/Time   First Provider Initiated Contact with Patient 05/26/24 0037      Chief Complaint  Patient presents with   calf pain   Tamara Sutton is a 23 y.o. G1P0 at [redacted]w[redacted]d who receives care at [facility].  She reports her next appt is Tuesday August 5th with Dr. CANDIE Carder as she is transferring.  She presents today for calf pain.  She states tonight around 2130 she had sudden pain and bulging varicose vein upon standing after sitting on couch for ~ 30 minutes.  She states she has history of varicose veins prior to pregnancy, but never bulging or painful so she was concerned.  She reports she has been wearing compression stocks while working and is active and not lazy.  She states the pain was a 5-6/10 earlier, but is now a 3/10.   OB History     Gravida  1   Para      Term      Preterm      AB      Living         SAB      IAB      Ectopic      Multiple      Live Births              Past Medical History:  Diagnosis Date   Heterozygous for MTHFR gene mutation     Past Surgical History:  Procedure Laterality Date   NO PAST SURGERIES      Family History  Problem Relation Age of Onset   Thyroid  disease Mother    Endometriosis Mother    Uterine cancer Maternal Grandmother    Heart disease Maternal Grandfather    Hypertension Maternal Grandfather    Hypertension Paternal Grandmother    Thyroid  disease Paternal Grandmother    ALS Paternal Grandmother    Leukemia Paternal Grandfather     Social History   Tobacco Use   Smoking status: Never   Smokeless tobacco: Never  Vaping Use   Vaping status: Never Used  Substance Use Topics   Alcohol use: No    Alcohol/week: 0.0 standard drinks of alcohol   Drug use: No    Allergies:  Allergies  Allergen Reactions   Wasp Venom Swelling    Allergy testing   Latex Rash    No medications prior to admission.     Review of Systems  Respiratory:  Negative for shortness of breath.   Cardiovascular:  Positive for chest pain (Once this evening, contributes to anxiety. 30 minutes prior to arrival that was sharp and lasted ~ 5 minutes) and leg swelling (Feet and ankles).  Gastrointestinal:  Negative for abdominal pain, nausea and vomiting.  Genitourinary:  Negative for difficulty urinating, dysuria, vaginal bleeding and vaginal discharge.  Musculoskeletal:  Negative for back pain.  Neurological:  Negative for dizziness, light-headedness and headaches.   Physical Exam   Blood pressure (!) 90/57, pulse 79, temperature 98.1 F (36.7 C), temperature source Oral, resp. rate 14, height 5' 4 (1.626 m), weight 86.2 kg.  Physical Exam Vitals and nursing note reviewed. Exam conducted with a chaperone present.  Constitutional:      Appearance: Normal appearance.  HENT:     Head: Normocephalic and atraumatic.  Eyes:     Conjunctiva/sclera: Conjunctivae normal.  Cardiovascular:     Rate and Rhythm: Normal rate.  Pulmonary:     Effort: Pulmonary effort is normal. No respiratory distress.  Musculoskeletal:     Cervical back: Normal range of motion.     Right lower leg: No swelling. No edema.     Left lower leg: No swelling. No edema.     Comments: Right calf with varicose veins ~ 6 cm in length. Bluish in color. No apparent heat, mild tenderness.   Measurements 42/41 cm on r/l ~ 8 cm below knee.  Skin:    General: Skin is warm and dry.  Neurological:     Mental Status: She is alert and oriented to person, place, and time.  Psychiatric:        Mood and Affect: Mood normal.        Behavior: Behavior normal.     MAU Course  Procedures No results found for this or any previous visit (from the past 24 hours).  MDM Exam  Assessment and Plan  23 year old, G1P0  SIUP at 20 weeks Varicose Veins  -Reviewed POC with patient. -Exam performed and findings discussed.  -Reassured that varicose  veins are common during pregnancy and can worsen as pregnancy progresses. -Discussed wearing compression stockings/socks during everyday activities including exercise. -Reviewed c/o pain and recommendation for r/o DVT. -Discussed returning tomorrow for US  and receiving Lovenox tonight.  -Patient declines and states she will review with primary provider on Tuesday. -Precautions reviewed. -Discharged to home in stable condition.   Harlene LITTIE Duncans MSN, CNM Advanced Practice Provider, [facility] 05/26/2024, 12:37 AM

## 2024-05-26 NOTE — MAU Note (Signed)
 Pt has come for varicose vein in right lower leg - was bulging more than now . Pain - was 5-6 at 930pm- but pain  is 3.

## 2024-05-28 DIAGNOSIS — M5386 Other specified dorsopathies, lumbar region: Secondary | ICD-10-CM | POA: Diagnosis not present

## 2024-05-28 DIAGNOSIS — M9903 Segmental and somatic dysfunction of lumbar region: Secondary | ICD-10-CM | POA: Diagnosis not present

## 2024-05-28 DIAGNOSIS — M9901 Segmental and somatic dysfunction of cervical region: Secondary | ICD-10-CM | POA: Diagnosis not present

## 2024-05-28 DIAGNOSIS — M5382 Other specified dorsopathies, cervical region: Secondary | ICD-10-CM | POA: Diagnosis not present

## 2024-05-29 DIAGNOSIS — Z3402 Encounter for supervision of normal first pregnancy, second trimester: Secondary | ICD-10-CM | POA: Diagnosis not present

## 2024-05-29 DIAGNOSIS — N898 Other specified noninflammatory disorders of vagina: Secondary | ICD-10-CM | POA: Diagnosis not present

## 2024-05-29 DIAGNOSIS — Z3689 Encounter for other specified antenatal screening: Secondary | ICD-10-CM | POA: Diagnosis not present

## 2024-05-29 DIAGNOSIS — O26892 Other specified pregnancy related conditions, second trimester: Secondary | ICD-10-CM | POA: Diagnosis not present

## 2024-05-30 ENCOUNTER — Encounter: Payer: Self-pay | Admitting: Physical Therapy

## 2024-05-30 ENCOUNTER — Ambulatory Visit: Payer: Self-pay | Admitting: Physical Therapy

## 2024-05-30 DIAGNOSIS — M6281 Muscle weakness (generalized): Secondary | ICD-10-CM

## 2024-05-30 DIAGNOSIS — M5459 Other low back pain: Secondary | ICD-10-CM | POA: Diagnosis not present
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
## 2024-06-05 DIAGNOSIS — M5382 Other specified dorsopathies, cervical region: Secondary | ICD-10-CM | POA: Diagnosis not present

## 2024-06-05 DIAGNOSIS — M9901 Segmental and somatic dysfunction of cervical region: Secondary | ICD-10-CM | POA: Diagnosis not present

## 2024-06-05 DIAGNOSIS — M9903 Segmental and somatic dysfunction of lumbar region: Secondary | ICD-10-CM | POA: Diagnosis not present

## 2024-06-05 DIAGNOSIS — M5386 Other specified dorsopathies, lumbar region: Secondary | ICD-10-CM | POA: Diagnosis not present

## 2024-06-21 DIAGNOSIS — M9901 Segmental and somatic dysfunction of cervical region: Secondary | ICD-10-CM | POA: Diagnosis not present

## 2024-06-21 DIAGNOSIS — M9903 Segmental and somatic dysfunction of lumbar region: Secondary | ICD-10-CM | POA: Diagnosis not present

## 2024-06-21 DIAGNOSIS — M5386 Other specified dorsopathies, lumbar region: Secondary | ICD-10-CM | POA: Diagnosis not present

## 2024-06-21 DIAGNOSIS — M5382 Other specified dorsopathies, cervical region: Secondary | ICD-10-CM | POA: Diagnosis not present

## 2024-07-05 DIAGNOSIS — M9903 Segmental and somatic dysfunction of lumbar region: Secondary | ICD-10-CM | POA: Diagnosis not present

## 2024-07-05 DIAGNOSIS — M9904 Segmental and somatic dysfunction of sacral region: Secondary | ICD-10-CM | POA: Diagnosis not present

## 2024-07-05 DIAGNOSIS — M9901 Segmental and somatic dysfunction of cervical region: Secondary | ICD-10-CM | POA: Diagnosis not present

## 2024-07-05 DIAGNOSIS — M9902 Segmental and somatic dysfunction of thoracic region: Secondary | ICD-10-CM | POA: Diagnosis not present

## 2024-07-06 ENCOUNTER — Ambulatory Visit: Attending: Obstetrics and Gynecology | Admitting: Physical Therapy

## 2024-07-06 DIAGNOSIS — M6281 Muscle weakness (generalized): Secondary | ICD-10-CM | POA: Diagnosis not present

## 2024-07-06 DIAGNOSIS — M5459 Other low back pain: Secondary | ICD-10-CM | POA: Insufficient documentation
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
## 2024-07-10 DIAGNOSIS — M9904 Segmental and somatic dysfunction of sacral region: Secondary | ICD-10-CM | POA: Diagnosis not present

## 2024-07-10 DIAGNOSIS — M9902 Segmental and somatic dysfunction of thoracic region: Secondary | ICD-10-CM | POA: Diagnosis not present

## 2024-07-10 DIAGNOSIS — M9901 Segmental and somatic dysfunction of cervical region: Secondary | ICD-10-CM | POA: Diagnosis not present

## 2024-07-10 DIAGNOSIS — M9903 Segmental and somatic dysfunction of lumbar region: Secondary | ICD-10-CM | POA: Diagnosis not present

## 2024-07-13 ENCOUNTER — Ambulatory Visit: Admitting: Physical Therapy

## 2024-07-13 DIAGNOSIS — M5459 Other low back pain: Secondary | ICD-10-CM | POA: Diagnosis not present

## 2024-07-13 DIAGNOSIS — M6281 Muscle weakness (generalized): Secondary | ICD-10-CM
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
## 2024-07-17 DIAGNOSIS — M9903 Segmental and somatic dysfunction of lumbar region: Secondary | ICD-10-CM | POA: Diagnosis not present

## 2024-07-17 DIAGNOSIS — M9902 Segmental and somatic dysfunction of thoracic region: Secondary | ICD-10-CM | POA: Diagnosis not present

## 2024-07-17 DIAGNOSIS — M9904 Segmental and somatic dysfunction of sacral region: Secondary | ICD-10-CM | POA: Diagnosis not present

## 2024-07-17 DIAGNOSIS — I788 Other diseases of capillaries: Secondary | ICD-10-CM | POA: Diagnosis not present

## 2024-07-17 DIAGNOSIS — M9901 Segmental and somatic dysfunction of cervical region: Secondary | ICD-10-CM | POA: Diagnosis not present

## 2024-07-20 ENCOUNTER — Ambulatory Visit: Admitting: Physical Therapy

## 2024-07-20 DIAGNOSIS — M6281 Muscle weakness (generalized): Secondary | ICD-10-CM | POA: Diagnosis not present

## 2024-07-20 DIAGNOSIS — M5459 Other low back pain: Secondary | ICD-10-CM | POA: Diagnosis not present

## 2024-07-20 DIAGNOSIS — R350 Frequency of micturition: Secondary | ICD-10-CM | POA: Diagnosis not present

## 2024-07-20 DIAGNOSIS — R300 Dysuria: Secondary | ICD-10-CM | POA: Diagnosis not present
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
## 2024-07-25 DIAGNOSIS — O43199 Other malformation of placenta, unspecified trimester: Secondary | ICD-10-CM | POA: Diagnosis not present

## 2024-07-25 DIAGNOSIS — Z3403 Encounter for supervision of normal first pregnancy, third trimester: Secondary | ICD-10-CM | POA: Diagnosis not present

## 2024-07-26 ENCOUNTER — Ambulatory Visit: Payer: Self-pay | Attending: Obstetrics and Gynecology | Admitting: Physical Therapy

## 2024-07-26 DIAGNOSIS — M9901 Segmental and somatic dysfunction of cervical region: Secondary | ICD-10-CM | POA: Diagnosis not present

## 2024-07-26 DIAGNOSIS — M5459 Other low back pain: Secondary | ICD-10-CM | POA: Diagnosis not present

## 2024-07-26 DIAGNOSIS — M6281 Muscle weakness (generalized): Secondary | ICD-10-CM | POA: Insufficient documentation

## 2024-07-26 DIAGNOSIS — M9902 Segmental and somatic dysfunction of thoracic region: Secondary | ICD-10-CM | POA: Diagnosis not present

## 2024-07-26 DIAGNOSIS — M9903 Segmental and somatic dysfunction of lumbar region: Secondary | ICD-10-CM | POA: Diagnosis not present

## 2024-07-26 DIAGNOSIS — M9904 Segmental and somatic dysfunction of sacral region: Secondary | ICD-10-CM | POA: Diagnosis not present
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
## 2024-07-26 NOTE — Patient Instructions (Signed)

## 2024-07-31 ENCOUNTER — Encounter: Payer: Self-pay | Admitting: Physical Therapy

## 2024-08-10 DIAGNOSIS — M9903 Segmental and somatic dysfunction of lumbar region: Secondary | ICD-10-CM | POA: Diagnosis not present

## 2024-08-10 DIAGNOSIS — M9901 Segmental and somatic dysfunction of cervical region: Secondary | ICD-10-CM | POA: Diagnosis not present

## 2024-08-10 DIAGNOSIS — M9902 Segmental and somatic dysfunction of thoracic region: Secondary | ICD-10-CM | POA: Diagnosis not present

## 2024-08-10 DIAGNOSIS — M9904 Segmental and somatic dysfunction of sacral region: Secondary | ICD-10-CM | POA: Diagnosis not present

## 2024-08-16 ENCOUNTER — Ambulatory Visit: Admitting: Physical Therapy

## 2024-08-22 DIAGNOSIS — M9901 Segmental and somatic dysfunction of cervical region: Secondary | ICD-10-CM | POA: Diagnosis not present

## 2024-08-22 DIAGNOSIS — Z114 Encounter for screening for human immunodeficiency virus [HIV]: Secondary | ICD-10-CM | POA: Diagnosis not present

## 2024-08-22 DIAGNOSIS — M9902 Segmental and somatic dysfunction of thoracic region: Secondary | ICD-10-CM | POA: Diagnosis not present

## 2024-08-22 DIAGNOSIS — M9904 Segmental and somatic dysfunction of sacral region: Secondary | ICD-10-CM | POA: Diagnosis not present

## 2024-08-22 DIAGNOSIS — O43199 Other malformation of placenta, unspecified trimester: Secondary | ICD-10-CM | POA: Diagnosis not present

## 2024-08-22 DIAGNOSIS — M9903 Segmental and somatic dysfunction of lumbar region: Secondary | ICD-10-CM | POA: Diagnosis not present

## 2024-08-22 DIAGNOSIS — O3660X0 Maternal care for excessive fetal growth, unspecified trimester, not applicable or unspecified: Secondary | ICD-10-CM | POA: Diagnosis not present

## 2024-08-22 DIAGNOSIS — Z3403 Encounter for supervision of normal first pregnancy, third trimester: Secondary | ICD-10-CM | POA: Diagnosis not present

## 2024-08-22 DIAGNOSIS — Z113 Encounter for screening for infections with a predominantly sexual mode of transmission: Secondary | ICD-10-CM | POA: Diagnosis not present

## 2024-08-28 ENCOUNTER — Ambulatory Visit: Attending: Obstetrics and Gynecology | Admitting: Physical Therapy

## 2024-08-28 DIAGNOSIS — M6281 Muscle weakness (generalized): Secondary | ICD-10-CM | POA: Insufficient documentation

## 2024-08-28 DIAGNOSIS — M5459 Other low back pain: Secondary | ICD-10-CM | POA: Diagnosis not present
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
## 2024-09-05 ENCOUNTER — Encounter: Admitting: Physical Therapy

## 2024-09-11 ENCOUNTER — Other Ambulatory Visit: Payer: Self-pay

## 2024-09-11 ENCOUNTER — Encounter: Admitting: Physical Therapy

## 2024-09-11 ENCOUNTER — Inpatient Hospital Stay (HOSPITAL_COMMUNITY)
Admission: AD | Admit: 2024-09-11 | Discharge: 2024-09-11 | Disposition: A | Discharge status: Home / Self Care | Attending: Obstetrics and Gynecology | Admitting: Obstetrics and Gynecology

## 2024-09-11 ENCOUNTER — Encounter (HOSPITAL_COMMUNITY): Payer: Self-pay | Admitting: Obstetrics and Gynecology

## 2024-09-11 DIAGNOSIS — Z3A35 35 weeks gestation of pregnancy: Secondary | ICD-10-CM | POA: Insufficient documentation

## 2024-09-11 DIAGNOSIS — O368130 Decreased fetal movements, third trimester, not applicable or unspecified: Secondary | ICD-10-CM | POA: Diagnosis not present

## 2024-09-11 HISTORY — DX: Polycystic ovarian syndrome: E28.2

## 2024-09-11 NOTE — MAU Note (Signed)
 Tamara Sutton is a 23 y.o. at [redacted]w[redacted]d here in MAU reporting: DFM since this morning, last FM at 1530. She reports +some Fms but not regular as usually, Denies LOF, VB, regular contractions, blurry vision, headaches, peripheral edema, or RUQ pain.   LMP: na Onset of complaint: all day but realized at 1530 Pain score: 0/10 Vitals:   09/11/24 1643  BP: 122/66  Pulse: 79  Resp: 16  SpO2: 100%     FHT: 145  Lab orders placed from triage: na

## 2024-09-11 NOTE — Discharge Instructions (Signed)
 Do daily kick ct, frequent small meals

## 2024-09-11 NOTE — MAU Provider Note (Signed)
 History     Chief Complaint  Patient presents with   Decreased Fetal Movement   23 yo G1P0 SF @ 35 3/[redacted] wk gestation presents with c/o decreased FM. Denies vaginal bleeding  OB History     Gravida  1   Para      Term      Preterm      AB      Living         SAB      IAB      Ectopic      Multiple      Live Births              Past Medical History:  Diagnosis Date   Heterozygous for MTHFR gene mutation    PCOS (polycystic ovarian syndrome)     Past Surgical History:  Procedure Laterality Date   NO PAST SURGERIES      Family History  Problem Relation Age of Onset   Thyroid  disease Mother    Endometriosis Mother    Uterine cancer Maternal Grandmother    Heart disease Maternal Grandfather    Hypertension Maternal Grandfather    Hypertension Paternal Grandmother    Thyroid  disease Paternal Grandmother    ALS Paternal Grandmother    Leukemia Paternal Grandfather     Social History   Tobacco Use   Smoking status: Never   Smokeless tobacco: Never  Vaping Use   Vaping status: Never Used  Substance Use Topics   Alcohol use: No    Alcohol/week: 0.0 standard drinks of alcohol   Drug use: No    Allergies:  Allergies  Allergen Reactions   Wasp Venom Swelling    Allergy testing   Latex Rash    Medications Prior to Admission  Medication Sig Dispense Refill Last Dose/Taking   aspirin EC 81 MG tablet Take 81 mg by mouth daily. Swallow whole.   09/10/2024   Prenatal MV-Min-Fe Fum-FA-DHA (PRENATAL 1 PO) Take by mouth.   09/10/2024     Physical Exam   Blood pressure 122/66, pulse 79, resp. rate 16, SpO2 100%.  General appearance: alert, cooperative, and no distress Lungs: clear to auscultation bilaterally Heart: regular rate and rhythm, S1, S2 normal, no murmur, click, rub or gallop Abdomen: gravid Extremities: no edema, redness or tenderness in the calves or thighs  Tracing: baseline 140 (+) accel to 160  Good variability No ctx ED  Course    IMP' DFM 3rd trimester IUP # 35 3/7 reassuring fetal testing. P)  D/c home. Daily kick ct.  MDM   Dickie DELENA Carder, MD 6:17 PM 09/11/2024

## 2024-09-17 DIAGNOSIS — O43199 Other malformation of placenta, unspecified trimester: Secondary | ICD-10-CM | POA: Diagnosis not present

## 2024-09-17 DIAGNOSIS — O3660X0 Maternal care for excessive fetal growth, unspecified trimester, not applicable or unspecified: Secondary | ICD-10-CM | POA: Diagnosis not present

## 2024-09-17 LAB — OB RESULTS CONSOLE GBS: GBS: NEGATIVE

## 2024-09-18 ENCOUNTER — Encounter: Admitting: Physical Therapy

## 2024-09-25 ENCOUNTER — Encounter: Admitting: Physical Therapy

## 2024-09-28 ENCOUNTER — Encounter: Payer: Self-pay | Admitting: Physical Therapy

## 2024-09-28 ENCOUNTER — Ambulatory Visit: Attending: Obstetrics and Gynecology | Admitting: Physical Therapy

## 2024-09-28 DIAGNOSIS — M6281 Muscle weakness (generalized): Secondary | ICD-10-CM | POA: Insufficient documentation

## 2024-09-28 DIAGNOSIS — M5459 Other low back pain: Secondary | ICD-10-CM | POA: Diagnosis not present
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
## 2024-10-03 DIAGNOSIS — O43199 Other malformation of placenta, unspecified trimester: Secondary | ICD-10-CM | POA: Diagnosis not present

## 2024-10-03 DIAGNOSIS — K2970 Gastritis, unspecified, without bleeding: Secondary | ICD-10-CM | POA: Diagnosis not present

## 2024-10-03 DIAGNOSIS — O23599 Infection of other part of genital tract in pregnancy, unspecified trimester: Secondary | ICD-10-CM | POA: Diagnosis not present

## 2024-10-03 DIAGNOSIS — O3663X0 Maternal care for excessive fetal growth, third trimester, not applicable or unspecified: Secondary | ICD-10-CM | POA: Diagnosis not present

## 2024-10-08 ENCOUNTER — Encounter (HOSPITAL_COMMUNITY): Payer: Self-pay | Admitting: *Deleted

## 2024-10-08 ENCOUNTER — Telehealth (HOSPITAL_COMMUNITY): Payer: Self-pay | Admitting: *Deleted

## 2024-10-08 NOTE — Telephone Encounter (Signed)
 Preadmission screen

## 2024-10-09 ENCOUNTER — Ambulatory Visit: Payer: Self-pay | Admitting: Physical Therapy

## 2024-10-11 ENCOUNTER — Encounter (HOSPITAL_COMMUNITY): Payer: Self-pay | Admitting: Obstetrics and Gynecology

## 2024-10-11 ENCOUNTER — Inpatient Hospital Stay (HOSPITAL_COMMUNITY)

## 2024-10-11 ENCOUNTER — Other Ambulatory Visit: Payer: Self-pay

## 2024-10-11 ENCOUNTER — Inpatient Hospital Stay (HOSPITAL_COMMUNITY)
Admission: RE | Admit: 2024-10-11 | Discharge: 2024-10-15 | DRG: 805 | Disposition: A | Discharge status: Home / Self Care | Attending: Obstetrics and Gynecology | Admitting: Obstetrics and Gynecology

## 2024-10-11 DIAGNOSIS — O411230 Chorioamnionitis, third trimester, not applicable or unspecified: Secondary | ICD-10-CM | POA: Diagnosis not present

## 2024-10-11 DIAGNOSIS — Z3490 Encounter for supervision of normal pregnancy, unspecified, unspecified trimester: Principal | ICD-10-CM

## 2024-10-11 DIAGNOSIS — O9902 Anemia complicating childbirth: Secondary | ICD-10-CM | POA: Diagnosis present

## 2024-10-11 DIAGNOSIS — K219 Gastro-esophageal reflux disease without esophagitis: Secondary | ICD-10-CM | POA: Diagnosis present

## 2024-10-11 DIAGNOSIS — O43193 Other malformation of placenta, third trimester: Principal | ICD-10-CM | POA: Diagnosis present

## 2024-10-11 DIAGNOSIS — Z3A39 39 weeks gestation of pregnancy: Secondary | ICD-10-CM

## 2024-10-11 DIAGNOSIS — O9962 Diseases of the digestive system complicating childbirth: Secondary | ICD-10-CM | POA: Diagnosis present

## 2024-10-11 DIAGNOSIS — O480 Post-term pregnancy: Secondary | ICD-10-CM | POA: Diagnosis not present

## 2024-10-11 DIAGNOSIS — Z3A40 40 weeks gestation of pregnancy: Secondary | ICD-10-CM | POA: Diagnosis not present

## 2024-10-11 DIAGNOSIS — Z8249 Family history of ischemic heart disease and other diseases of the circulatory system: Secondary | ICD-10-CM | POA: Diagnosis not present

## 2024-10-11 DIAGNOSIS — O99214 Obesity complicating childbirth: Secondary | ICD-10-CM | POA: Diagnosis not present

## 2024-10-11 DIAGNOSIS — O43893 Other placental disorders, third trimester: Secondary | ICD-10-CM | POA: Diagnosis not present

## 2024-10-11 DIAGNOSIS — E669 Obesity, unspecified: Secondary | ICD-10-CM | POA: Diagnosis not present

## 2024-10-11 LAB — CBC
HCT: 34.5 % — ABNORMAL LOW (ref 36.0–46.0)
Hemoglobin: 12.1 g/dL (ref 12.0–15.0)
MCH: 31.8 pg (ref 26.0–34.0)
MCHC: 35.1 g/dL (ref 30.0–36.0)
MCV: 90.6 fL (ref 80.0–100.0)
Platelets: 227 K/uL (ref 150–400)
RBC: 3.81 MIL/uL — ABNORMAL LOW (ref 3.87–5.11)
RDW: 12.7 % (ref 11.5–15.5)
WBC: 7.9 K/uL (ref 4.0–10.5)
nRBC: 0 % (ref 0.0–0.2)

## 2024-10-11 LAB — TYPE AND SCREEN
ABO/RH(D): A POS
Antibody Screen: NEGATIVE

## 2024-10-11 MED ORDER — OXYTOCIN-SODIUM CHLORIDE 30-0.9 UT/500ML-% IV SOLN
2.5000 [IU]/h | INTRAVENOUS | Status: DC
  Filled 2024-10-11: qty 500

## 2024-10-11 MED ORDER — TERBUTALINE SULFATE 1 MG/ML IJ SOLN: 0.2500 mg | Freq: Once | INTRAMUSCULAR | Status: DC | PRN

## 2024-10-11 MED ORDER — LACTATED RINGERS IV SOLN: 500.0000 mL | INTRAVENOUS | Status: AC | PRN

## 2024-10-11 MED ORDER — MISOPROSTOL 50MCG HALF TABLET
50.0000 ug | ORAL_TABLET | ORAL | Status: DC
  Administered 2024-10-11 – 2024-10-12 (×2): 50 ug via ORAL
  Filled 2024-10-11 (×2): qty 1

## 2024-10-11 MED ORDER — ACETAMINOPHEN 325 MG PO TABS: 650.0000 mg | ORAL_TABLET | ORAL | Status: DC | PRN

## 2024-10-11 MED ORDER — ONDANSETRON HCL 4 MG/2ML IJ SOLN
4.0000 mg | Freq: Four times a day (QID) | INTRAMUSCULAR | Status: DC | PRN
  Administered 2024-10-13: 4 mg via INTRAVENOUS
  Filled 2024-10-11: qty 2

## 2024-10-11 MED ORDER — OXYTOCIN BOLUS FROM INFUSION
333.0000 mL | Freq: Once | INTRAVENOUS | Status: AC
  Administered 2024-10-13: 333 mL via INTRAVENOUS

## 2024-10-11 MED ORDER — SOD CITRATE-CITRIC ACID 500-334 MG/5ML PO SOLN: 30.0000 mL | ORAL | Status: DC | PRN

## 2024-10-11 MED ORDER — OXYCODONE-ACETAMINOPHEN 5-325 MG PO TABS: 2.0000 | ORAL_TABLET | ORAL | Status: DC | PRN

## 2024-10-11 MED ORDER — OXYCODONE-ACETAMINOPHEN 5-325 MG PO TABS: 1.0000 | ORAL_TABLET | ORAL | Status: DC | PRN

## 2024-10-11 MED ORDER — LIDOCAINE HCL (PF) 1 % IJ SOLN
30.0000 mL | INTRAMUSCULAR | Status: AC | PRN
  Administered 2024-10-13: 30 mL via SUBCUTANEOUS

## 2024-10-11 MED ADMIN — Misoprostol Tab 100 MCG: 50 ug | ORAL | @ 19:00:00 | NDC 99999010018

## 2024-10-11 MED ADMIN — Misoprostol Tab 100 MCG: 25 ug | VAGINAL | @ 19:00:00 | NDC 99999010017

## 2024-10-11 MED FILL — Misoprostol Tab 100 MCG: 25.0000 ug | ORAL | Qty: 1 | Status: AC

## 2024-10-11 MED FILL — Misoprostol Tab 100 MCG: 50.0000 ug | ORAL | Qty: 1 | Status: AC

## 2024-10-11 NOTE — H&P (Signed)
 Tamara Sutton is a 23 y.o. female presenting for ***. OB History     Gravida  2   Para      Term      Preterm      AB      Living         SAB      IAB      Ectopic      Multiple      Live Births             Past Medical History:  Diagnosis Date   Heterozygous for MTHFR gene mutation    PCOS (polycystic ovarian syndrome)    Past Surgical History:  Procedure Laterality Date   NO PAST SURGERIES     Family History: family history includes ALS in her paternal grandmother; Endometriosis in her mother; Heart disease in her maternal grandfather; Hypertension in her maternal grandfather and paternal grandmother; Leukemia in her paternal grandfather; Thyroid  disease in her mother and paternal grandmother; Uterine cancer in her maternal grandmother. Social History:  reports that she has never smoked. She has never used smokeless tobacco. She reports that she does not drink alcohol and does not use drugs.     Maternal Diabetes: {Maternal Diabetes:3043596} Genetic Screening: {Genetic Screening:20205} Maternal Ultrasounds/Referrals: {Maternal Ultrasounds / Referrals:20211} Fetal Ultrasounds or other Referrals:  {Fetal Ultrasounds or Other Referrals:20213} Maternal Substance Abuse:  {Maternal Substance Abuse:20223} Significant Maternal Medications:  {Significant Maternal Meds:20233} Significant Maternal Lab Results:  {Significant Maternal Lab Results:20235} Number of Prenatal Visits:{Prenatal Visits:27860} Maternal Vaccinations:{Maternal Immunizations:31012} Other Comments:  {Other Comments:20251}  Review of Systems History   Blood pressure 122/73, pulse 90, height 5' 4 (1.626 m), weight 98 kg, unknown if currently breastfeeding. Exam Physical Exam  Prenatal labs: ABO, Rh: --/--/PENDING (12/18 1720) Antibody: PENDING (12/18 1720) Rubella: Immune (06/30 0000) RPR: Nonreactive (06/30 0000)  HBsAg: Negative (06/30 0000)  HIV: Non-reactive (06/30 0000)  GBS:  Negative/-- (11/24 0000)   Assessment/Plan: ***   Xavius Spadafore A Darriel Sinquefield 10/11/2024, 6:36 PM

## 2024-10-12 ENCOUNTER — Inpatient Hospital Stay (HOSPITAL_COMMUNITY): Admitting: Anesthesiology

## 2024-10-12 LAB — SYPHILIS: RPR W/REFLEX TO RPR TITER AND TREPONEMAL ANTIBODIES, TRADITIONAL SCREENING AND DIAGNOSIS ALGORITHM: RPR Ser Ql: NONREACTIVE

## 2024-10-12 MED ORDER — FENTANYL CITRATE (PF) 100 MCG/2ML IJ SOLN
100.0000 ug | INTRAMUSCULAR | Status: DC | PRN
  Administered 2024-10-12 (×3): 100 ug via INTRAVENOUS
  Filled 2024-10-12 (×2): qty 2

## 2024-10-12 MED ORDER — LACTATED RINGERS IV SOLN: INTRAVENOUS | Status: DC

## 2024-10-12 MED ORDER — PHENYLEPHRINE 80 MCG/ML (10ML) SYRINGE FOR IV PUSH (FOR BLOOD PRESSURE SUPPORT): 80.0000 ug | PREFILLED_SYRINGE | INTRAVENOUS | Status: DC | PRN

## 2024-10-12 MED ORDER — EPHEDRINE 5 MG/ML INJ: 10.0000 mg | INTRAVENOUS | Status: DC | PRN

## 2024-10-12 MED ORDER — DIPHENHYDRAMINE HCL 50 MG/ML IJ SOLN
25.0000 mg | Freq: Once | INTRAMUSCULAR | Status: AC
  Administered 2024-10-12: 25 mg via INTRAVENOUS
  Filled 2024-10-12: qty 1

## 2024-10-12 MED ORDER — TERBUTALINE SULFATE 1 MG/ML IJ SOLN: 0.2500 mg | Freq: Once | INTRAMUSCULAR | Status: DC | PRN

## 2024-10-12 MED ORDER — OXYTOCIN-SODIUM CHLORIDE 30-0.9 UT/500ML-% IV SOLN
1.0000 m[IU]/min | INTRAVENOUS | Status: DC
  Administered 2024-10-12 (×2): 2 m[IU]/min via INTRAVENOUS
  Filled 2024-10-12: qty 500

## 2024-10-12 MED ORDER — DIPHENHYDRAMINE HCL 50 MG/ML IJ SOLN: 12.5000 mg | INTRAMUSCULAR | Status: DC | PRN

## 2024-10-12 MED ORDER — FENTANYL CITRATE (PF) 100 MCG/2ML IJ SOLN
INTRAMUSCULAR | Status: AC
  Filled 2024-10-12: qty 2

## 2024-10-12 MED ORDER — FENTANYL-BUPIVACAINE-NACL 0.5-0.125-0.9 MG/250ML-% EP SOLN
12.0000 mL/h | EPIDURAL | Status: DC | PRN
  Administered 2024-10-12: 12 mL/h via EPIDURAL
  Filled 2024-10-12: qty 250

## 2024-10-12 MED ORDER — CALCIUM CARBONATE ANTACID 500 MG PO CHEW
2.0000 | CHEWABLE_TABLET | Freq: Once | ORAL | Status: AC
  Administered 2024-10-12: 400 mg via ORAL
  Filled 2024-10-12: qty 2

## 2024-10-12 MED ORDER — LACTATED RINGERS IV SOLN: 500.0000 mL | Freq: Once | INTRAVENOUS | Status: DC

## 2024-10-12 MED ORDER — LIDOCAINE HCL (PF) 1 % IJ SOLN
INTRAMUSCULAR | Status: DC | PRN
  Administered 2024-10-12 (×2): 5 mL via EPIDURAL

## 2024-10-12 NOTE — Progress Notes (Signed)
 GLENDENE WYER is a 23 y.o. G1P0 at [redacted]w[redacted]d by LMP admitted for induction of labor due to marginal cord placenta.  Subjective: No chief complaint on file.   Objective: BP 117/68 (BP Location: Right Arm)   Pulse 65   Temp 98.2 F (36.8 C) (Oral)   Resp 16   Ht 5' 4 (1.626 m)   Wt 98 kg   BMI 37.09 kg/m  No intake/output data recorded. No intake/output data recorded.  FHT:  FHR: 150 bpm, variability: moderate,  accelerations:  Present,  decelerations:  Absent UC:   irregular, every 2-2,5  minutes SVE:   1 cm dilated, 70% effaced, -3 station Intracervical balloon Tracing: cat 1  Labs: Lab Results  Component Value Date   WBC 7.9 10/11/2024   HGB 12.1 10/11/2024   HCT 34.5 (L) 10/11/2024   MCV 90.6 10/11/2024   PLT 227 10/11/2024    Assessment / Plan: Latent phase Marginal cord insertion Term gestation P) start  pitocin    Anticipated MOD:  guarded  Atianna Haidar A Tylisa Alcivar 10/12/2024, 8:37 AM

## 2024-10-12 NOTE — Anesthesia Preprocedure Evaluation (Signed)
"                                    Anesthesia Evaluation  Patient identified by MRN, date of birth, ID band Patient awake    Reviewed: Allergy & Precautions, Patient's Chart, lab work & pertinent test results  Airway Mallampati: II  TM Distance: >3 FB     Dental no notable dental hx. (+) Teeth Intact   Pulmonary neg pulmonary ROS   Pulmonary exam normal        Cardiovascular negative cardio ROS Normal cardiovascular exam Rhythm:Regular     Neuro/Psych negative neurological ROS  negative psych ROS   GI/Hepatic Neg liver ROS,GERD  ,,  Endo/Other  Obesity  Renal/GU negative Renal ROS  negative genitourinary   Musculoskeletal negative musculoskeletal ROS (+)    Abdominal  (+) + obese  Peds  Hematology  (+) Blood dyscrasia, anemia   Anesthesia Other Findings   Reproductive/Obstetrics (+) Pregnancy Marginal insertion of UC                              Anesthesia Physical Anesthesia Plan  ASA: 2  Anesthesia Plan: Epidural   Post-op Pain Management: Minimal or no pain anticipated   Induction:   PONV Risk Score and Plan: Treatment may vary due to age or medical condition  Airway Management Planned: Natural Airway  Additional Equipment: Fetal Monitoring and None  Intra-op Plan:   Post-operative Plan:   Informed Consent: I have reviewed the patients History and Physical, chart, labs and discussed the procedure including the risks, benefits and alternatives for the proposed anesthesia with the patient or authorized representative who has indicated his/her understanding and acceptance.       Plan Discussed with: Anesthesiologist  Anesthesia Plan Comments:          Anesthesia Quick Evaluation  "

## 2024-10-12 NOTE — Anesthesia Procedure Notes (Signed)
 Epidural Patient location during procedure: OB Start time: 10/12/2024 7:36 PM End time: 10/12/2024 7:45 PM  Staffing Anesthesiologist: Jerrye Sharper, MD Performed: anesthesiologist   Preanesthetic Checklist Completed: patient identified, IV checked, site marked, risks and benefits discussed, surgical consent, monitors and equipment checked, pre-op evaluation and timeout performed  Epidural Patient position: sitting Prep: DuraPrep and site prepped and draped Patient monitoring: continuous pulse ox and blood pressure Approach: midline Location: L3-L4 Injection technique: LOR air  Needle:  Needle type: Tuohy  Needle gauge: 17 G Needle length: 9 cm and 9 Needle insertion depth: 6 cm Catheter type: closed end flexible Catheter size: 19 Gauge Catheter at skin depth: 11 cm Test dose: negative and Other  Assessment Events: blood not aspirated, no cerebrospinal fluid, injection not painful, no injection resistance, no paresthesia and negative IV test  Additional Notes Patient identified. Risks and benefits discussed including failed block, incomplete  Pain control, post dural puncture headache, nerve damage, paralysis, blood pressure Changes, nausea, vomiting, reactions to medications-both toxic and allergic and post Partum back pain. All questions were answered. Patient expressed understanding and wished to proceed. Sterile technique was used throughout procedure. Epidural site was Dressed with sterile barrier dressing. No paresthesias, signs of intravascular injection Or signs of intrathecal spread were encountered.  Patient was more comfortable after the epidural was dosed. Please see RN's note for documentation of vital signs and FHR which are stable. Reason for block:procedure for pain

## 2024-10-12 NOTE — Progress Notes (Incomplete)
 Tamara Sutton is a 23 y.o. G1P0 at [redacted]w[redacted]d by {CHL MAU METHOD OF PREG DETERMINATION:21616} admitted for {Reason for Admission:21615}  Subjective: No chief complaint on file.   Objective: BP (!) 111/59   Pulse 71   Temp 97.7 F (36.5 C) (Oral)   Resp 16   Ht 5' 4 (1.626 m)   Wt 98 kg   BMI 37.09 kg/m  No intake/output data recorded. No intake/output data recorded.  FHT:  {findings; monitor fetal heart monitor:21620} UC:   {obgyn contractions reg/irreg:312982} SVE:   {pe cervical exam intrapartum:314522} Tracing: ***  Labs: Lab Results  Component Value Date   WBC 7.9 10/11/2024   HGB 12.1 10/11/2024   HCT 34.5 (L) 10/11/2024   MCV 90.6 10/11/2024   PLT 227 10/11/2024    Assessment / Plan: {CHL LABOR ASSESSMENT:21627}   Anticipated MOD:  {FNI:78373}  Ruwayda Curet A Mckinley Olheiser 10/12/2024, 2:40 PM

## 2024-10-12 NOTE — Progress Notes (Deleted)
 Patient requested to not have blood pressure taken until 0700.

## 2024-10-12 NOTE — Progress Notes (Signed)
 Tamara Sutton is a 23 y.o. G1P0 at [redacted]w[redacted]d by LMP admitted for marginal cord insertion  Subjective: Notes contractions  Objective: BP 115/76   Pulse (!) 103   Temp 98.8 F (37.1 C) (Axillary)   Resp 16   Ht 5' 4 (1.626 m)   Wt 98 kg   BMI 37.09 kg/m  No intake/output data recorded. No intake/output data recorded. Pitocin 14 miu FHT:  FHR: 150 bpm, variability: moderate,  accelerations:  Present,  decelerations:  Absent UC:   irregular, every 3-4 minutes SVE:   4 cm dilated, swollen effaced, -2 station AROM clear fluid . IUPC placed Tracing: cat 1  Labs: Lab Results  Component Value Date   WBC 7.9 10/11/2024   HGB 12.1 10/11/2024   HCT 34.5 (L) 10/11/2024   MCV 90.6 10/11/2024   PLT 227 10/11/2024    Assessment / Plan: Protracted latent phase Term gestation Marginal cord insertion P) d/c  pitocin( pitocin break). Calcium and benadryl. Circuit and resume pitocin after 1 hr  Anticipated MOD:  guarded  Louana Fontenot A Amelie Caracci 10/12/2024, 6:48 PM

## 2024-10-13 ENCOUNTER — Encounter (HOSPITAL_COMMUNITY): Payer: Self-pay | Admitting: Obstetrics and Gynecology

## 2024-10-13 LAB — CBC
HCT: 34.2 % — ABNORMAL LOW (ref 36.0–46.0)
Hemoglobin: 11.8 g/dL — ABNORMAL LOW (ref 12.0–15.0)
MCH: 31.3 pg (ref 26.0–34.0)
MCHC: 34.5 g/dL (ref 30.0–36.0)
MCV: 90.7 fL (ref 80.0–100.0)
Platelets: 195 K/uL (ref 150–400)
RBC: 3.77 MIL/uL — ABNORMAL LOW (ref 3.87–5.11)
RDW: 12.3 % (ref 11.5–15.5)
WBC: 16.8 K/uL — ABNORMAL HIGH (ref 4.0–10.5)
nRBC: 0 % (ref 0.0–0.2)

## 2024-10-13 MED ORDER — ONDANSETRON HCL 4 MG/2ML IJ SOLN: 4.0000 mg | INTRAMUSCULAR | Status: DC | PRN

## 2024-10-13 MED ORDER — SIMETHICONE 80 MG PO CHEW: 80.0000 mg | CHEWABLE_TABLET | ORAL | Status: DC | PRN

## 2024-10-13 MED ORDER — LIDOCAINE HCL (PF) 1 % IJ SOLN
INTRAMUSCULAR | Status: AC
  Filled 2024-10-13: qty 30

## 2024-10-13 MED ORDER — SODIUM CHLORIDE 0.9 % IV SOLN
2.0000 g | Freq: Four times a day (QID) | INTRAVENOUS | Status: AC
  Administered 2024-10-13 (×2): 2 g via INTRAVENOUS
  Filled 2024-10-13 (×2): qty 2000

## 2024-10-13 MED ORDER — TRANEXAMIC ACID-NACL 1000-0.7 MG/100ML-% IV SOLN
INTRAVENOUS | Status: AC
  Filled 2024-10-13: qty 100

## 2024-10-13 MED ORDER — GENTAMICIN SULFATE 40 MG/ML IJ SOLN: 5.0000 mg/kg | Freq: Once | INTRAVENOUS | Status: DC

## 2024-10-13 MED ORDER — WITCH HAZEL-GLYCERIN EX PADS: 1.0000 | MEDICATED_PAD | CUTANEOUS | Status: DC | PRN

## 2024-10-13 MED ORDER — ZOLPIDEM TARTRATE 5 MG PO TABS: 5.0000 mg | ORAL_TABLET | Freq: Every evening | ORAL | Status: DC | PRN

## 2024-10-13 MED ORDER — GENTAMICIN SULFATE 40 MG/ML IJ SOLN
5.0000 mg/kg | INTRAVENOUS | Status: DC
  Administered 2024-10-13: 360 mg via INTRAVENOUS
  Filled 2024-10-13: qty 9

## 2024-10-13 MED ORDER — SODIUM CHLORIDE 0.9 % IV SOLN
2.0000 g | Freq: Four times a day (QID) | INTRAVENOUS | Status: DC
  Administered 2024-10-13: 2 g via INTRAVENOUS

## 2024-10-13 MED ORDER — FERROUS SULFATE 325 (65 FE) MG PO TABS
325.0000 mg | ORAL_TABLET | Freq: Two times a day (BID) | ORAL | Status: DC
  Administered 2024-10-13 – 2024-10-15 (×3): 325 mg via ORAL
  Filled 2024-10-13 (×4): qty 1

## 2024-10-13 MED ORDER — ACETAMINOPHEN 500 MG PO TABS
1000.0000 mg | ORAL_TABLET | Freq: Four times a day (QID) | ORAL | Status: DC | PRN
  Administered 2024-10-13: 1000 mg via ORAL
  Filled 2024-10-13: qty 2

## 2024-10-13 MED ORDER — LACTATED RINGERS IV SOLN: INTRAVENOUS | Status: DC

## 2024-10-13 MED ORDER — ONDANSETRON HCL 4 MG PO TABS: 4.0000 mg | ORAL_TABLET | ORAL | Status: DC | PRN

## 2024-10-13 MED ORDER — LACTATED RINGERS IV BOLUS: 1000.0000 mL | Freq: Once | INTRAVENOUS | Status: DC

## 2024-10-13 MED ORDER — TRANEXAMIC ACID-NACL 1000-0.7 MG/100ML-% IV SOLN
1000.0000 mg | Freq: Once | INTRAVENOUS | Status: AC
  Administered 2024-10-13: 1000 mg via INTRAVENOUS

## 2024-10-13 MED ORDER — DIBUCAINE (PERIANAL) 1 % EX OINT: 1.0000 | TOPICAL_OINTMENT | CUTANEOUS | Status: DC | PRN

## 2024-10-13 MED ORDER — COCONUT OIL OIL: 1.0000 | TOPICAL_OIL | Status: DC | PRN

## 2024-10-13 MED ORDER — PRENATAL MULTIVITAMIN CH
1.0000 | ORAL_TABLET | Freq: Every day | ORAL | Status: DC
  Filled 2024-10-13 (×2): qty 1

## 2024-10-13 MED ORDER — BENZOCAINE-MENTHOL 20-0.5 % EX AERO
1.0000 | INHALATION_SPRAY | CUTANEOUS | Status: DC | PRN
  Administered 2024-10-13: 1 via TOPICAL
  Filled 2024-10-13: qty 56

## 2024-10-13 MED ORDER — SENNOSIDES-DOCUSATE SODIUM 8.6-50 MG PO TABS
2.0000 | ORAL_TABLET | Freq: Every day | ORAL | Status: DC
  Administered 2024-10-14 – 2024-10-15 (×2): 2 via ORAL
  Filled 2024-10-13 (×2): qty 2

## 2024-10-13 MED ORDER — IBUPROFEN 600 MG PO TABS
600.0000 mg | ORAL_TABLET | Freq: Four times a day (QID) | ORAL | Status: DC
  Administered 2024-10-13 – 2024-10-15 (×6): 600 mg via ORAL
  Filled 2024-10-13 (×8): qty 1

## 2024-10-13 MED ORDER — SODIUM CHLORIDE 0.9 % IV SOLN
2.0000 g | Freq: Four times a day (QID) | INTRAVENOUS | Status: DC
  Filled 2024-10-13: qty 2000

## 2024-10-13 MED ORDER — DIPHENHYDRAMINE HCL 25 MG PO CAPS: 25.0000 mg | ORAL_CAPSULE | Freq: Four times a day (QID) | ORAL | Status: DC | PRN

## 2024-10-13 MED ORDER — ACETAMINOPHEN 325 MG PO TABS
650.0000 mg | ORAL_TABLET | ORAL | Status: DC | PRN
  Administered 2024-10-13 – 2024-10-14 (×4): 650 mg via ORAL
  Filled 2024-10-13 (×4): qty 2

## 2024-10-13 NOTE — Anesthesia Postprocedure Evaluation (Signed)
"   Anesthesia Post Note  Patient: Tamara Sutton  Procedure(s) Performed: AN AD HOC LABOR EPIDURAL     Patient location during evaluation: Mother Baby Anesthesia Type: Epidural Level of consciousness: awake and alert Pain management: pain level controlled Vital Signs Assessment: post-procedure vital signs reviewed and stable Respiratory status: spontaneous breathing, nonlabored ventilation and respiratory function stable Cardiovascular status: stable Postop Assessment: no headache, no backache and epidural receding Anesthetic complications: no   No notable events documented.  Last Vitals:  Vitals:   10/13/24 1217 10/13/24 1632  BP: 104/60 118/78  Pulse: 74 92  Resp:  18  Temp: 37.1 C 36.7 C  SpO2: 99% 100%    Last Pain:  Vitals:   10/13/24 1632  TempSrc: Oral  PainSc:    Pain Goal:                   [name]      "

## 2024-10-13 NOTE — Progress Notes (Signed)
 S:  comfortable  O:  pitocin  20 miu Epidural VS: BP 89/62 VE per RN 9.5 cm/90%/0 station  Tracing: baseline 130 (+) accels to 150-155 Ctx  q  IMP; active labor Marginal cord insertion Term P) continue present mgmt

## 2024-10-13 NOTE — Lactation Note (Signed)
 This note was copied from a baby's chart. Lactation Consultation Note  Patient Name: Tamara Sutton Date: 10/13/2024 Age:25 hours Reason for consult: Initial assessment;Primapara;1st time breastfeeding;Term;Maternal endocrine disorder;Breastfeeding assistance  P1- MOB reports that infant has been latching well since birth, but with her last feeding she was too tired to latch. MOB reports colostrum is noted when hand expressing and pumping. LC offered to assist with latching infant and MOB was receptive. Infant was placed on the left breast in the cradle hold. LC reviewed how to latch using the cross cradle hold and belly to belly positioning. After a few attempts, infant was receptive and nursed for 5 minutes before falling asleep again. Infant had a deep latch with chomping primarily. LC reviewed how with practice the chomping will turn into nutritive sucking. Infant was left STS on MOB's chest.  LC reviewed the first 24 hr birthday nap, day 2 custer feeding, feeding infant on cue 8-12x in 24 hrs, not allowing infant to go over 3 hrs without a feeding, CDC milk storage guidelines, LC services handout and engorgement/breast care. LC encouraged MOB to call for further assistance as needed.  Maternal Data Has patient been taught Hand Expression?: Yes Does the patient have breastfeeding experience prior to this delivery?: No  Feeding Mother's Current Feeding Choice: Breast Milk  LATCH Score Latch: Grasps breast easily, tongue down, lips flanged, rhythmical sucking.  Audible Swallowing: A few with stimulation  Type of Nipple: Flat  Comfort (Breast/Nipple): Soft / non-tender  Hold (Positioning): Full assist, staff holds infant at breast  LATCH Score: 6   Lactation Tools Discussed/Used Tools: Pump;Flanges Flange Size: 18 Breast pump type: Manual Pump Education: Setup, frequency, and cleaning;Milk Storage Pumping frequency: 15-20 min every 3 hrs as  needed  Interventions Interventions: Breast feeding basics reviewed;Assisted with latch;Breast compression;Adjust position;Support pillows;Position options;Hand pump;Education;LC Services brochure  Discharge Discharge Education: Engorgement and breast care;Warning signs for feeding baby Pump: DEBP;Manual;Hands Free;Personal  Consult Status Consult Status: Follow-up Date: 10/14/24 Follow-up type: In-patient    Recardo Hoit BS, IBCLC 10/13/2024, 8:49 PM

## 2024-10-13 NOTE — Consult Note (Signed)
 ANTIBIOTIC CONSULT NOTE - INITIAL  Pharmacy Consult for Gentamicin  Indication: Chorioamnionitis   Allergies[1]  Patient Measurements: Height: 5' 4 (162.6 cm) Weight: 98 kg (216 lb 0.8 oz) IBW/kg (Calculated) : 54.7 Adjusted Body Weight: 72 kg   Vital Signs: Temp: 100.2 F (37.9 C) (12/20 0727) Temp Source: Axillary (12/20 0727) BP: 109/63 (12/20 0600) Pulse Rate: 75 (12/20 0600)  Labs: Recent Labs    10/11/24 1747  WBC 7.9  HGB 12.1  PLT 227   No results for input(s): GENTTROUGH, GENTPEAK, GENTRANDOM in the last 72 hours.   Microbiology: Recent Results (from the past 720 hours)  OB RESULT CONSOLE Group B Strep     Status: None   Collection Time: 09/17/24 12:00 AM  Result Value Ref Range Status   GBS Negative  Final    Medications:  Ampicillin  2 g every 6 hours (12/20>>  Gentamicin  360 mg every 24 hours (12/20>>   Assessment: 23 y.o. female G1P0 at [redacted]w[redacted]d presenting for IOL now with fever requiring treatment with ampicillin /gentamicin  for chorioamnionitis. Pharmacy has been consulted for gentamicin  dosing.   Goal of Therapy:  Gentamicin  peak 6-8 mg/L and Trough < 1 mg/L  Plan:  Gentamicin  360 mg IV every 24 hrs  Check Scr with next labs if gentamicin  continued. Will check gentamicin  levels if continued > 72hr or clinically indicated.  Clotilda LULLA Prime 10/13/2024,8:32 AM     [1]  Allergies Allergen Reactions   Wasp Venom Swelling    Allergy testing   Latex Rash

## 2024-10-13 NOTE — Progress Notes (Signed)
 S: comfortable  O: Pitocin  18 MIU Epidural  VS:  BP 112/75  VE 6/90/-2 (+) caput  Tracing: cat 1 Baseline 145 (+) accels Ctx q 2 mins   IMP: Protracted active phase Marginal cord insertion Term gestation Disc with pt . Risk of PPH, fail despite complete dilation P) re-examine in 2hr. In no change, c/s. Pt agrees with plan

## 2024-10-13 NOTE — Progress Notes (Signed)
 Pt pushing Tracing notable for repetitive deep variables Maternal temp 100.2 AROM < 24 hr  GBS negative Tylenol  1 g Ucx ordered Amp/gent IV  Disc vacuum with pt. Declines Pushing well  Anticipate possible shoulder dystocia PPH,  fetal indication for NICU due to variables. To be present for delivery

## 2024-10-14 LAB — CBC
HCT: 29 % — ABNORMAL LOW (ref 36.0–46.0)
Hemoglobin: 10 g/dL — ABNORMAL LOW (ref 12.0–15.0)
MCH: 31.5 pg (ref 26.0–34.0)
MCHC: 34.5 g/dL (ref 30.0–36.0)
MCV: 91.5 fL (ref 80.0–100.0)
Platelets: 172 K/uL (ref 150–400)
RBC: 3.17 MIL/uL — ABNORMAL LOW (ref 3.87–5.11)
RDW: 12.9 % (ref 11.5–15.5)
WBC: 13.8 K/uL — ABNORMAL HIGH (ref 4.0–10.5)
nRBC: 0 % (ref 0.0–0.2)

## 2024-10-14 LAB — CULTURE, OB URINE: Culture: NO GROWTH

## 2024-10-14 NOTE — Lactation Note (Signed)
 This note was copied from a baby's chart. Lactation Consultation Note  Patient Name: Tamara Sutton Unijb'd Date: 10/14/2024 Age:25 hours  Reason for consult: Term;Primapara;1st time breastfeeding;Breastfeeding assistance;Maternal endocrine disorder/PCOS  P1, [redacted]w[redacted]d, 2.73% weight loss  Follow up LC visit with mother and baby Tamara Penelope. Baby has been sleepy and having brief latches. Mother has used the manual pump and hand expressed to supplement baby. Mother gave 5 ml of DBM by bottle prior to my arrival. Donor breast milk has been ordered at mother's request and consent.  Mother reports feeling pinching and discomfort with latching. She requested for LC to assess infants mouth for tongue tie. Baby's labial frenulum is thick and attached to the notch in the center of gum line. Baby can lift and extend her tongue and she has a short thick lingual frenulum. When baby is latched, her upper lip curls outward. Discussed with mother to talk to her pediatrician and have them assess if nipple pain does not decrease.   Due to baby being 30 hrs old and not feeding well, mother would like to pump to stimulate her milk supply. Instructed mother on the use, frequency, cleaning of breast pump and storage of breast milk. She was fitted with a 18 mm flange but would needs a smaller insert (15 mm) after discharge. She was informed how to order smaller inserts after discharge.   Mother was also instructed how to finger feed with curved tip syringe when she collects colostrum. Advised to request help from her nurse.   Mother will pump and supplement until baby is latching and feeding well at the breast.                   Maternal Data Has patient been taught Hand Expression?: Yes  Feeding Mother's Current Feeding Choice: Breast Milk and Donor Milk  LATCH Score Latch: Repeated attempts needed to sustain latch, nipple held in mouth throughout feeding, stimulation needed to elicit sucking  reflex.  Audible Swallowing: A few with stimulation  Type of Nipple: Everted at rest and after stimulation (short shaft, retracts and flattens when breast is held for latching)    Hold (Positioning): Assistance needed to correctly position infant at breast and maintain latch.     Lactation Tools Discussed/Used Tools: Pump;Flanges Flange Size: 18 Breast pump type: Double-Electric Breast Pump;Manual Pump Education: Setup, frequency, and cleaning;Milk Storage Reason for Pumping: stimulate milk production, supplement with EBM, baby to start donor breast milk Pumping frequency: every 3 hrs for 15 minutes in the initiate setting  Interventions Interventions: Breast feeding basics reviewed;Assisted with latch;Hand express;Support pillows;Adjust position;Coconut oil;DEBP;Hand pump;Education    Consult Status Consult Status: Follow-up Date: 10/15/24    Joshua Rojelio HERO 10/14/2024, 4:14 PM
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
## 2024-10-14 NOTE — Progress Notes (Signed)
 PPD1 SVD:   S:  Pt reports feeling well. Had BM/ Tolerating po/ Voiding without problems/ No n/v/ Bleeding is light/ Pain controlled withprescription NSAID's including ibuprofen  (Motrin )  Newborn info live female BRF   O:  A & O x 3 / VS: Blood pressure 104/67, pulse 67, temperature 98.2 F (36.8 C), resp. rate 18, height 5' 4 (1.626 m), weight 98 kg, SpO2 99%, unknown if currently breastfeeding.  LABS:  Results for orders placed or performed during the hospital encounter of 10/11/24 (from the past 24 hours)  CBC     Status: Abnormal   Collection Time: 10/13/24 11:25 AM  Result Value Ref Range   WBC 16.8 (H) 4.0 - 10.5 K/uL   RBC 3.77 (L) 3.87 - 5.11 MIL/uL   Hemoglobin 11.8 (L) 12.0 - 15.0 g/dL   HCT 65.7 (L) 63.9 - 53.9 %   MCV 90.7 80.0 - 100.0 fL   MCH 31.3 26.0 - 34.0 pg   MCHC 34.5 30.0 - 36.0 g/dL   RDW 87.6 88.4 - 84.4 %   Platelets 195 150 - 400 K/uL   nRBC 0.0 0.0 - 0.2 %  CBC     Status: Abnormal   Collection Time: 10/14/24  5:14 AM  Result Value Ref Range   WBC 13.8 (H) 4.0 - 10.5 K/uL   RBC 3.17 (L) 3.87 - 5.11 MIL/uL   Hemoglobin 10.0 (L) 12.0 - 15.0 g/dL   HCT 70.9 (L) 63.9 - 53.9 %   MCV 91.5 80.0 - 100.0 fL   MCH 31.5 26.0 - 34.0 pg   MCHC 34.5 30.0 - 36.0 g/dL   RDW 87.0 88.4 - 84.4 %   Platelets 172 150 - 400 K/uL   nRBC 0.0 0.0 - 0.2 %    I&O: I/O last 3 completed shifts: In: -  Out: 3407 [Urine:2300; Blood:1107]   No intake/output data recorded.  Lungs: chest clear, no wheezing, rales, normal symmetric air entry  Heart: regular rate and rhythm, S1, S2 normal, no murmur, click, rub or gallop  Abdomen: soft uterus at umb firm  Perineum: not inspected  Lochia: light  Extremities:no redness or tenderness in the calves or thighs, edema tr+    A/P: PPD # 1/ G1P1001  Doing well  Continue routine post partum orders  Anticipate d/c in am

## 2024-10-15 MED ORDER — FLUCONAZOLE 150 MG PO TABS
150.0000 mg | ORAL_TABLET | Freq: Once | ORAL | Status: AC
  Administered 2024-10-15: 150 mg via ORAL
  Filled 2024-10-15: qty 1

## 2024-10-15 MED ORDER — FLUCONAZOLE 150 MG PO TABS: 150.0000 mg | ORAL_TABLET | Freq: Once | ORAL | 0 refills | Status: AC

## 2024-10-15 MED ORDER — IBUPROFEN 600 MG PO TABS: 600.0000 mg | ORAL_TABLET | Freq: Four times a day (QID) | ORAL | 11 refills | Status: AC | PRN

## 2024-10-15 NOTE — Lactation Note (Signed)
 This note was copied from a baby's chart. Lactation Consultation Note  Patient Name: Tamara Sutton Date: 10/15/2024 Age:25 hours. P1  Reason for consult: Maternal endocrine disorder;Follow-up assessment;Primapara;1st time breastfeeding;Infant weight loss;Term;Difficult latch;Breastfeeding assistance (4 % weight loss) LC offered to assist to latch and mom preferred to use the cross cradle.  Baby was on / off at 1st  and then with help latched for 5 mins.  Due to areola edema, LC explained to mom  this is going to take some time due to the areola edema.  LC mentioned starting a NS and mom declined.  LC recommended the following plan:  Feed with cues and by 3 hours STS.  Breast shells while awake between feeding to enhance the elasticity of the nipple / areola complex.  Prior to latching, hand express, pre- pump ( Hand pump)  and reverse pressure.  LC stressed and showed mom how far onto the areola the baby's mouth should be to latched properly. ( LC reassured mom  the shells and pumping will help to make the areola more compressible and the nipple to be more erect.  LC recommended taking the baby to the breast calm and , feed for 15 -20 mins , supplement with EBM and or formula 30 ml. Post pump both breast for 15 mins. Save milk for the next feeding.  Switch to the 2nd breast next feeding and do the same.  LC reviewed engorgement prevention and tx .  LC reassured mom with shells, consistent pre-pumping and post pumping the breast tissue will improve.  LC reviewed breast feeding D/C teaching and the [facility] resources.  LC offered to request and LC O/P and mom receptive to [facility] O/P. LC p[laced a request for the [facility] O/P to call mom.   Maternal Data Has patient been taught Hand Expression?: Yes Does the patient have breastfeeding experience prior to this delivery?: No  Feeding Mother's Current Feeding Choice: Breast Milk and Donor Milk  LATCH Score Latch: Repeated attempts needed to  sustain latch, nipple held in mouth throughout feeding, stimulation needed to elicit sucking reflex.  Audible Swallowing: A few with stimulation  Type of Nipple: Everted at rest and after stimulation (areola edema causing the areola to shorten)  Comfort (Breast/Nipple): Soft / non-tender  Hold (Positioning): Assistance needed to correctly position infant at breast and maintain latch.  LATCH Score: 7   Lactation Tools Discussed/Used Tools: Shells;Pump;Flanges Flange Size: 18 Breast pump type: Double-Electric Breast Pump;Manual Pump Education: Setup, frequency, and cleaning;Milk Storage  Interventions Interventions: Breast feeding basics reviewed;Assisted with latch;Skin to skin;Hand express;Pre-pump if needed;Reverse pressure;Breast compression;Adjust position;Support pillows;Position options;Shells;Hand pump;DEBP;Education;Pace feeding;LC Services brochure;CDC milk storage guidelines;CDC Guidelines for Breast Pump Cleaning  Discharge Discharge Education: Engorgement and breast care;Warning signs for feeding baby;Outpatient recommendation;Outpatient Epic message sent;Other (comment) (mom aware she will receive a LC O/P phone call.) Pump: DEBP;Manual;Hands Free;Personal  Consult Status Consult Status: Complete Date: 10/15/24    Tamara Sutton 10/15/2024, 9:23 AM

## 2024-10-15 NOTE — Discharge Instructions (Signed)
 Call if temperature greater than equal to 100.4, nothing per vagina for 4-6 weeks or severe nausea vomiting, increased incisional pain , drainage or redness in the incision site, no straining with bowel movements, showers no bath

## 2024-10-15 NOTE — Discharge Summary (Signed)
 "    Postpartum Discharge Summary  Date of Service updated     Patient Name: Tamara Sutton DOB: January 22, 2001 MRN: [id number]  Date of admission: 10/11/2024 Delivery date:10/13/2024 Delivering provider: Jnaya Butrick Date of discharge: 10/15/2024  Admitting diagnosis: Pregnancy [Z34.90] Marginal insertion of umbilical cord affecting management of mother in third trimester [O43.193] Postpartum care following vaginal delivery [Z39.2] Intrauterine pregnancy: [redacted]w[redacted]d     Secondary diagnosis:  Principal Problem:   Pregnancy Active Problems:   Marginal insertion of umbilical cord affecting management of mother in third trimester   Postpartum care following vaginal delivery  Additional problems: none    Discharge diagnosis: Term Pregnancy Delivered and marginal cord insertion  , chorioamnionitis                                            Post partum procedures:n/a Augmentation: AROM Complications: none  Hospital course: Induction of Labor With Vaginal Delivery   23 y.o. yo G1P1001 at [redacted]w[redacted]d was admitted to the hospital 10/11/2024 for induction of labor.  Indication for induction: marginal cord insertion.  Patient had an labor course complicated by protracted active phase, presumed chorioamnionitis  treated with amp/gentamicin , deep variables deceleration Membrane Rupture Time/Date: 6:24 PM,10/12/2024  Delivery Method:Vaginal, Spontaneous Operative Delivery:N/A Episiotomy: None Lacerations:  2nd degree Details of delivery can be found in separate delivery note.  Patient had a postpartum course complicated by none. Patient is discharged home 10/19/2024.  Newborn Data: Birth date:10/13/2024 Birth time:8:44 AM Gender:Female Living status:Living Apgars:7 ,9  Weight:4030 g  Magnesium Sulfate received: No BMZ received: No Rhophylac:N/A MMR:N/A T-DaP:Given prenatally Flu: No RSV Vaccine received: No Transfusion:No Immunizations administered: Immunization History  Administered  Date(s) Administered   PPD Test 07/27/2019, 08/07/2019    Physical exam  Vitals:   10/14/24 0505 10/14/24 1751 10/14/24 2004 10/15/24 0544  BP: 104/67 120/66 115/71 112/60  Pulse: 67 70 76 60  Resp:  16 18 18   Temp: 98.2 F (36.8 C) 98.6 F (37 C) 98.3 F (36.8 C) 98 F (36.7 C)  TempSrc:  Oral Oral Oral  SpO2: 99% 99%  100%  Weight:      Height:       General: alert, cooperative, and no distress Lochia: appropriate Uterine Fundus: firm Incision: N/A DVT Evaluation: No evidence of DVT seen on physical exam. Calf/Ankle edema is present Labs: Lab Results  Component Value Date   WBC 13.8 (H) 10/14/2024   HGB 10.0 (L) 10/14/2024   HCT 29.0 (L) 10/14/2024   MCV 91.5 10/14/2024   PLT 172 10/14/2024       No data to display         Edinburgh Score:     No data to display            After visit meds:  Allergies as of 10/15/2024       Reactions   Wasp Venom Swelling   Allergy testing   Latex Rash        Medication List     TAKE these medications    fluconazole  150 MG tablet Commonly known as: DIFLUCAN  Take 1 tablet (150 mg total) by mouth once for 1 dose.   ibuprofen  600 MG tablet Commonly known as: ADVIL  Take 1 tablet (600 mg total) by mouth every 6 (six) hours as needed.   PRENATAL 1 PO Take by mouth.  Discharge home in stable condition Infant Feeding: Breast Infant Disposition:home with mother Discharge instruction: per After Visit Summary and Postpartum booklet. Activity: Advance as tolerated. Pelvic rest for 6 weeks.  Diet: iron rich diet Anticipated Birth Control: Unsure Postpartum Appointment:6 weeks Additional Postpartum F/U: n/a Future Appointments:No future appointments. Follow up Visit:  Follow-up Information     Rutherford Gain, MD Follow up in 6 week(s).   Specialty: Obstetrics and Gynecology Contact information: [address] [place] [address] [phone]                      10/15/2024 Gain DELENA Rutherford, MD   "

## 2024-10-15 NOTE — Progress Notes (Signed)
 PPD{NUMBERS 1-5:20334} SVD:   S:  Pt reports feeling ***/ Tolerating po/ Voiding without problems/ No n/v/ Bleeding is {Description; bleeding vaginal:11356}/ Pain controlled with{treatments; pain control med:13496}  Newborn info ***   O:  A & O x 3 ***/ VS: Blood pressure 112/60, pulse 60, temperature 98 F (36.7 C), temperature source Oral, resp. rate 18, height 5' 4 (1.626 m), weight 98 kg, SpO2 100%, unknown if currently breastfeeding.  LABS: No results found for this or any previous visit (from the past 24 hours).  I&O: No intake/output data recorded.   No intake/output data recorded.  Lungs: {Exam; lungs:5033}  Heart: {Exam; heart:5510}  Abdomen: {PE ABDOMEN POSTPARTUM OBGYN:313106}  Perineum: {exam; perineum:10172}  Lochia: ***  Extremities:{pe extremities na:685541}    A/P: PPD # ***/ G1P1001  Doing well  Continue routine post partum orders  ***

## 2024-10-16 LAB — SURGICAL PATHOLOGY

## 2024-10-22 ENCOUNTER — Telehealth (HOSPITAL_COMMUNITY): Payer: Self-pay | Admitting: *Deleted

## 2024-10-22 NOTE — Telephone Encounter (Signed)
 10/22/2024  Name: Tamara Sutton MRN: [id number] DOB: [birth year]  Reason for Call:  Transition of Care Hospital Discharge Call  Contact Status: Patient Contact Status: Complete  Language assistant needed: Interpreter Mode: Interpreter Not Needed        Follow-Up Questions: Do You Have Any Concerns About Your Health As You Heal From Delivery?: Yes What Concerns Do You Have About Your Health?: Patient states that her stitches have been increasingly uncomfortable even though she has been taking her tylenol  and being careful. Patient also states that she feels that she is having some baby blues and feels that she may need some medication. Patient states that she has already called her OB office and is waiting for a call back to speak with someone. Encouraged patient to answer her OB's office callback and mention her discomfort with her stitches and to mention her emotional status as well. Do You Have Any Concerns About Your Infants Health?: No  Edinburgh Postnatal Depression Scale:  In the Past 7 Days: I have been able to laugh and see the funny side of things.: Not quite so much now I have looked forward with enjoyment to things.: Definitely less than I used to I have blamed myself unnecessarily when things went wrong.: Yes, some of the time I have been anxious or worried for no good reason.: Hardly ever I have felt scared or panicky for no good reason.: No, not at all Things have been getting on top of me.: Yes, sometimes I haven't been coping as well as usual I have been so unhappy that I have had difficulty sleeping.: Not very often I have felt sad or miserable.: Yes, quite often I have been so unhappy that I have been crying.: Yes, quite often The thought of harming myself has occurred to me.: Never Edinburgh Postnatal Depression Scale Total: (!) 13  PHQ2-9 Depression Scale:     Discharge Follow-up: Edinburgh score requires follow up?: Yes Provider notified of Edinburgh score?:  Yes (Dr. Rutherford) Have you already been referred for a counseling appointment?: No Patient was advised of the following resources:: Breastfeeding Support Group, Support Group Patient referred to:: OB  Post-discharge interventions: Reviewed Newborn Safe Sleep Practices Maternal Mental Health Resources provided  [name]  10/22/2024 1025
